# Patient Record
Sex: Female | Born: 1965
Health system: Southern US, Community
[De-identification: ages and names within clinical notes are randomized; demographics above are authoritative.]

## PROBLEM LIST (undated history)

## (undated) DIAGNOSIS — D126 Benign neoplasm of colon, unspecified: Secondary | ICD-10-CM

## (undated) DIAGNOSIS — K219 Gastro-esophageal reflux disease without esophagitis: Secondary | ICD-10-CM

## (undated) DIAGNOSIS — J189 Pneumonia, unspecified organism: Secondary | ICD-10-CM

## (undated) DIAGNOSIS — D649 Anemia, unspecified: Secondary | ICD-10-CM

## (undated) DIAGNOSIS — R001 Bradycardia, unspecified: Secondary | ICD-10-CM

## (undated) DIAGNOSIS — R0789 Other chest pain: Principal | ICD-10-CM

## (undated) HISTORY — PX: OTHER SURGICAL HISTORY: SHX169

## (undated) HISTORY — DX: Bradycardia, unspecified: R00.1

## (undated) HISTORY — PX: BREAST SURGERY: SHX581

## (undated) HISTORY — PX: TUBAL LIGATION: SHX77

## (undated) HISTORY — PX: UPPER GI ENDOSCOPY: SHX6162

## (undated) HISTORY — PX: HAND SURGERY: SHX662

## (undated) HISTORY — PX: ORIF ACETABULAR FRACTURE: SHX5029

## (undated) HISTORY — DX: Benign neoplasm of colon, unspecified: D12.6

## (undated) HISTORY — DX: Other chest pain: R07.89

## (undated) HISTORY — PX: EYE SURGERY: SHX253

---

## 1991-05-01 HISTORY — PX: WISDOM TOOTH EXTRACTION: SHX21

## 1999-04-03 ENCOUNTER — Observation Stay (HOSPITAL_COMMUNITY): Admission: AD | Admit: 1999-04-03 | Discharge: 1999-04-04 | Payer: Self-pay | Admitting: Obstetrics and Gynecology

## 1999-06-06 ENCOUNTER — Inpatient Hospital Stay (HOSPITAL_COMMUNITY): Admission: AD | Admit: 1999-06-06 | Discharge: 1999-06-19 | Payer: Self-pay | Admitting: Obstetrics & Gynecology

## 1999-06-06 ENCOUNTER — Encounter: Payer: Self-pay | Admitting: Obstetrics and Gynecology

## 1999-06-20 ENCOUNTER — Encounter: Admission: RE | Admit: 1999-06-20 | Discharge: 1999-08-09 | Payer: Self-pay | Admitting: Obstetrics and Gynecology

## 1999-07-28 ENCOUNTER — Other Ambulatory Visit: Admission: RE | Admit: 1999-07-28 | Discharge: 1999-07-28 | Payer: Self-pay | Admitting: Obstetrics and Gynecology

## 1999-11-08 ENCOUNTER — Other Ambulatory Visit: Admission: RE | Admit: 1999-11-08 | Discharge: 1999-11-08 | Payer: Self-pay | Admitting: *Deleted

## 2000-02-16 ENCOUNTER — Other Ambulatory Visit: Admission: RE | Admit: 2000-02-16 | Discharge: 2000-02-16 | Payer: Self-pay | Admitting: *Deleted

## 2000-05-29 ENCOUNTER — Other Ambulatory Visit: Admission: RE | Admit: 2000-05-29 | Discharge: 2000-05-29 | Payer: Self-pay | Admitting: *Deleted

## 2000-10-23 ENCOUNTER — Other Ambulatory Visit: Admission: RE | Admit: 2000-10-23 | Discharge: 2000-10-23 | Payer: Self-pay | Admitting: *Deleted

## 2001-11-26 ENCOUNTER — Other Ambulatory Visit: Admission: RE | Admit: 2001-11-26 | Discharge: 2001-11-26 | Payer: Self-pay | Admitting: Obstetrics & Gynecology

## 2002-11-30 ENCOUNTER — Other Ambulatory Visit: Admission: RE | Admit: 2002-11-30 | Discharge: 2002-11-30 | Payer: Self-pay | Admitting: Obstetrics & Gynecology

## 2005-11-28 ENCOUNTER — Encounter: Admission: RE | Admit: 2005-11-28 | Discharge: 2005-11-28 | Payer: Self-pay | Admitting: Obstetrics and Gynecology

## 2005-12-13 ENCOUNTER — Encounter: Admission: RE | Admit: 2005-12-13 | Discharge: 2005-12-13 | Payer: Self-pay | Admitting: Obstetrics and Gynecology

## 2006-05-17 ENCOUNTER — Ambulatory Visit: Payer: Self-pay | Admitting: Internal Medicine

## 2006-05-30 ENCOUNTER — Ambulatory Visit: Payer: Self-pay | Admitting: Internal Medicine

## 2006-05-30 ENCOUNTER — Encounter (INDEPENDENT_AMBULATORY_CARE_PROVIDER_SITE_OTHER): Payer: Self-pay | Admitting: *Deleted

## 2007-01-02 ENCOUNTER — Encounter: Admission: RE | Admit: 2007-01-02 | Discharge: 2007-01-02 | Payer: Self-pay | Admitting: Obstetrics and Gynecology

## 2008-01-06 ENCOUNTER — Encounter: Admission: RE | Admit: 2008-01-06 | Discharge: 2008-01-06 | Payer: Self-pay | Admitting: Obstetrics and Gynecology

## 2008-06-14 ENCOUNTER — Emergency Department (HOSPITAL_COMMUNITY): Admission: EM | Admit: 2008-06-14 | Discharge: 2008-06-14 | Payer: Self-pay | Admitting: Emergency Medicine

## 2008-07-26 ENCOUNTER — Encounter: Admission: RE | Admit: 2008-07-26 | Discharge: 2008-07-26 | Payer: Self-pay | Admitting: Obstetrics and Gynecology

## 2009-02-11 ENCOUNTER — Encounter: Admission: RE | Admit: 2009-02-11 | Discharge: 2009-02-11 | Payer: Self-pay | Admitting: Obstetrics and Gynecology

## 2010-02-14 ENCOUNTER — Encounter: Admission: RE | Admit: 2010-02-14 | Discharge: 2010-02-14 | Payer: Self-pay | Admitting: Obstetrics and Gynecology

## 2010-02-24 ENCOUNTER — Encounter: Admission: RE | Admit: 2010-02-24 | Discharge: 2010-02-24 | Payer: Self-pay | Admitting: Obstetrics and Gynecology

## 2010-05-21 ENCOUNTER — Encounter: Payer: Self-pay | Admitting: Obstetrics and Gynecology

## 2010-09-15 NOTE — Op Note (Signed)
[facility]  Patient:    Michelle Mcneil                       MRN: [id number] Proc. Date: 04/03/99 Adm. Date:  [id number] Attending:  Osborn Coho                           Operative Report  PREOPERATIVE DIAGNOSIS:       Intrauterine pregnancy at 20 weeks estimated gestational age.  History of incompetent cervix.  POSTOPERATIVE DIAGNOSIS:      Intrauterine pregnancy at 20 weeks estimated gestational age.  History of incompetent cervix.  OPERATION:                    Placement of McDonald cerclage.  SURGEON:                      Mark E. Dareen Piano, M.D.  ASSISTANT:  ANESTHESIA:                   Spinal.  ESTIMATED BLOOD LOSS:         Minimal.  COMPLICATIONS:                None.  DESCRIPTION OF PROCEDURE:     The patient was taken to the operating room where a spinal anesthetic was administered without complications.  She was then placed n the dorsal lithotomy position and prepped with Hibiclens.  Her bladder was drained with a red rubber catheter.  A sterile speculum was placed in the vagina.  1 Prolene was used for McDonald cerclage.  The cerclage was placed in a circumferential counter clockwise fashion.  One suture was placed.  The patient  tolerated the procedure well.  She was taken to the recovery room in stable condition.  The patient will be monitored for contractions.  If no contractions are noted six to eight hours from now, she will be discharged to home.  The patient had Ancef 1 gram used for prophylaxis. DD:  04/03/99 TD:  04/03/99 Job: [id number]

## 2010-09-15 NOTE — Discharge Summary (Signed)
[facility]  Patient:    Michelle Mcneil, Michelle Mcneil                      MRN: [id number] Adm. Date:  [id number] Disc. Date: [id number] Attending:  Conley Simmonds A Dictator:   Leilani Able, P.A.                           Discharge Summary  FINAL DIAGNOSES:              1. Intrauterine pregnancy at 38-4/[redacted] weeks gestation.                               2. Preterm premature rupture of membranes.                               3. Status post McDonald cerclage at 20 weeks.                               4. Spontaneous vaginal delivery of a female infant ith                                  Apgars of 5 and 7.  HISTORY OF PRESENT ILLNESS:   This 45 year old, G3, P0-1-1-1, presents at 28+ weeks gestation with preterm premature rupture of membranes.  The patient did not have any contractions and was not having any fevers at this time.  She had had a McDonald cerclage placed around 20 weeks.  HOSPITAL COURSE:              She was admitted at this time for cultures, started on ampicillin 2 g IV every six hours, and started on betamethasone protocol. Labs were checked and an ultrasound was obtained.  A neonatology consult was also given to the patient for the potential of a preterm delivery.  The cerclage was to remain in place unless infection developed or tocolysis failed.  At the beginning of the patients hospital course, she was not having any contractions or cramping and no leaking of fluid.  The fetal heart rate was reactive.  The patient was taken off IV ampicillin and started on amoxicillin 500 mg t.i.d. during her hospital course.  She continued to have no signs of chorioamnionitis.  She was also not having any contractions and tocolysis was not needed to be used.  On hospital day #15, which was June 17, 1999, the patient started developing contractions.  Her sedimentation rate was 50.  Labor was thought to be secondary to  subclinical chorioamnionitis.  At this point, cerclage was to be removed and the patient would be allowed to deliver.  The cerclage was removed.  An epidural was placed.  The  patient was started on Ancef 1 g IV every eight hours.  She progressed rapidly o labor.  The fetal heart rate was reactive with some variable deceleration.  She  dilated to complete.  She had spontaneous vaginal delivery of a 3 pound 8 ounce  female infant with Apgars of 5 and 7.  The pediatrics team was present.  There were no vaginal lacerations.  The procedure went without complications.  The baby was taken to the NICU at this  point.  The patients postpartum course was benign without significant fever.  She was felt ready for discharge on postpartum day 2. Upon her discharge, the baby was stable in the NICU and she was pumping for breast milk.  DIET:                         She was sent home on a regular diet.  ACTIVITIES:                   Told to decrease activities.  DISCHARGE MEDICATIONS:        Told to continue prenatal vitamins.  She was going to use over-the-counter pain medicines as needed.  Was given FESO4 325 mg one b.i.d.  FOLLOW-UP:                    Told to follow up in the office in four to six weeks.  LABORATORY DATA:              On discharge, the patient had a hemoglobin of 10.7 and white blood cell count of 19.9. DD:  07/07/99 TD:  07/08/99 Job: [id number]

## 2011-01-02 ENCOUNTER — Other Ambulatory Visit: Payer: Self-pay | Admitting: Obstetrics and Gynecology

## 2011-01-02 DIAGNOSIS — Z1231 Encounter for screening mammogram for malignant neoplasm of breast: Secondary | ICD-10-CM

## 2011-02-23 ENCOUNTER — Ambulatory Visit
Admission: RE | Admit: 2011-02-23 | Discharge: 2011-02-23 | Disposition: A | Discharge status: Home / Self Care | Payer: 59 | Source: Ambulatory Visit | Attending: Obstetrics and Gynecology | Admitting: Obstetrics and Gynecology

## 2011-02-23 ENCOUNTER — Ambulatory Visit: Payer: Self-pay

## 2011-02-23 DIAGNOSIS — Z1231 Encounter for screening mammogram for malignant neoplasm of breast: Secondary | ICD-10-CM

## 2011-06-22 ENCOUNTER — Encounter: Payer: Self-pay | Admitting: Internal Medicine

## 2011-06-28 ENCOUNTER — Other Ambulatory Visit (HOSPITAL_COMMUNITY): Payer: Self-pay | Admitting: Otolaryngology

## 2011-07-04 ENCOUNTER — Other Ambulatory Visit (HOSPITAL_COMMUNITY): Payer: Self-pay

## 2011-07-04 ENCOUNTER — Inpatient Hospital Stay (HOSPITAL_COMMUNITY): Admission: RE | Admit: 2011-07-04 | Payer: Self-pay | Source: Ambulatory Visit

## 2011-07-04 ENCOUNTER — Ambulatory Visit (HOSPITAL_COMMUNITY): Payer: 59 | Attending: Otolaryngology

## 2011-08-28 ENCOUNTER — Encounter: Payer: Self-pay | Admitting: Internal Medicine

## 2011-10-19 ENCOUNTER — Encounter: Payer: Self-pay | Admitting: Internal Medicine

## 2011-12-17 ENCOUNTER — Ambulatory Visit (AMBULATORY_SURGERY_CENTER): Payer: 59 | Admitting: *Deleted

## 2011-12-17 VITALS — Ht 62.0 in | Wt 155.0 lb

## 2011-12-17 DIAGNOSIS — Z1211 Encounter for screening for malignant neoplasm of colon: Secondary | ICD-10-CM

## 2011-12-17 MED ORDER — MOVIPREP 100 G PO SOLR: ORAL | Status: DC

## 2011-12-18 ENCOUNTER — Encounter: Payer: Self-pay | Admitting: Internal Medicine

## 2011-12-21 ENCOUNTER — Telehealth: Payer: Self-pay | Admitting: *Deleted

## 2011-12-21 ENCOUNTER — Telehealth: Payer: Self-pay | Admitting: Internal Medicine

## 2011-12-21 NOTE — Telephone Encounter (Signed)
Spoke with Vinay at [facility]. Pt has 50.00 copay with moviprep and was inquiring if they could use generic. Informed that we do not use generic and that patient could use suprep but would have to come back in for instructions per Dottie. Inquired if he was aware if the patient had been given a rebate which he did not know. He was going to call the patient to tell her there was no generic available and inquire about the rebate and inform her to ask for it when she comes in for her procedure if she had not received one. I also told him that I would call the patient and let her know the same.

## 2011-12-21 NOTE — Telephone Encounter (Signed)
Called and spoke with patient regarding phone call from Vinay at [facility] that prep was too expensive. She was only inquiring if there may be a generic. Informed her that we do not use the generic form and she was accepting of that. Inquired if she had been given a rebate for the Movi-prep and she states that she was. She is agreeable to using moviprep and will pick it up from her pharmacy.

## 2011-12-28 ENCOUNTER — Telehealth: Payer: Self-pay | Admitting: Internal Medicine

## 2011-12-28 DIAGNOSIS — Z86010 Personal history of colonic polyps: Secondary | ICD-10-CM

## 2012-01-01 ENCOUNTER — Other Ambulatory Visit: Payer: Self-pay | Admitting: *Deleted

## 2012-01-01 DIAGNOSIS — Z86010 Personal history of colonic polyps: Secondary | ICD-10-CM

## 2012-01-01 NOTE — Telephone Encounter (Signed)
Scheduled patient at [facility] endo on 01/07/12 at 11:15 AM Lanora Manis) Case number [id number]. Left a message for patient to call me.

## 2012-01-01 NOTE — Telephone Encounter (Signed)
Please add her for colon this coming Monday 01/08/2012 after Mr Mordecai Maes at [facility]

## 2012-01-01 NOTE — Telephone Encounter (Signed)
Monday is the only day patient can have her recall colonoscopy due to scheduling issues. She is interested in getting it done as soon as possible because she is already late in getting it scheduled. She understands that Dr. Juanda Chance has limited appointments on Monday and is willing to switch MD's if needed. Please, advise

## 2012-01-01 NOTE — Telephone Encounter (Signed)
Left a message for patient to call me. 

## 2012-01-02 ENCOUNTER — Encounter: Payer: 59 | Admitting: Internal Medicine

## 2012-01-02 NOTE — Telephone Encounter (Signed)
I will not be available on Sept 16. She is too picky for somebody who needs screening colonoscopy. Just schedule her for the , Monday ,sept 23.

## 2012-01-02 NOTE — Telephone Encounter (Signed)
Spoke with patient and she cannot do the colonoscopy on 01/07/12 but is willing to do it on the next Monday 01/14/12. Please, advise.

## 2012-01-03 NOTE — Telephone Encounter (Signed)
Spoke with patient and offered Sept 29th for patient to have her procedure. She states she does not understand why she cannot switch doctors and have the procedure on Sept. 16th at 4:00 PM like the scheduler told her since it was the next available. Explained to patient the procedure to switch physicians and she continues to state I don't understand why I can't just switch MD to the one that has the next available procedure time that I want. Patient states she will take the Sept 29th date and cancel if she cannot do it. Left a message with [facility] endo to call me to change appointment.

## 2012-01-03 NOTE — Telephone Encounter (Signed)
Spoke with [facility] endo and moved patient to 12/21/11 9:15 AM arrival for 10:15 AM procedure. Pattricia Boss will call patient and offer this time.

## 2012-01-04 NOTE — Telephone Encounter (Signed)
Michelle Mcneil spoke with patient and patient agreed to procedure date/time. Reviewed instructions for colonoscopy.  She already did a pre visit and has her Moviprep.

## 2012-01-21 ENCOUNTER — Encounter (HOSPITAL_COMMUNITY): Payer: Self-pay | Admitting: *Deleted

## 2012-01-21 ENCOUNTER — Ambulatory Visit (HOSPITAL_COMMUNITY)
Admission: RE | Admit: 2012-01-21 | Discharge: 2012-01-21 | Disposition: A | Discharge status: Home / Self Care | Payer: 59 | Source: Ambulatory Visit | Attending: Internal Medicine | Admitting: Internal Medicine

## 2012-01-21 ENCOUNTER — Encounter (HOSPITAL_COMMUNITY)
Admission: RE | Disposition: A | Discharge status: Home / Self Care | Payer: Self-pay | Source: Ambulatory Visit | Attending: Internal Medicine

## 2012-01-21 DIAGNOSIS — Z860100 Personal history of colon polyps, unspecified: Secondary | ICD-10-CM

## 2012-01-21 DIAGNOSIS — Z1211 Encounter for screening for malignant neoplasm of colon: Secondary | ICD-10-CM | POA: Insufficient documentation

## 2012-01-21 HISTORY — PX: COLONOSCOPY: SHX5424

## 2012-01-21 SURGERY — COLONOSCOPY
Anesthesia: Moderate Sedation

## 2012-01-21 MED ORDER — FENTANYL CITRATE 0.05 MG/ML IJ SOLN
INTRAMUSCULAR | Status: DC | PRN
  Administered 2012-01-21 (×3): 25 ug via INTRAVENOUS

## 2012-01-21 MED ORDER — DIPHENHYDRAMINE HCL 50 MG/ML IJ SOLN
INTRAMUSCULAR | Status: AC
  Filled 2012-01-21: qty 1

## 2012-01-21 MED ORDER — FENTANYL CITRATE 0.05 MG/ML IJ SOLN
INTRAMUSCULAR | Status: AC
  Filled 2012-01-21: qty 4

## 2012-01-21 MED ORDER — MIDAZOLAM HCL 5 MG/5ML IJ SOLN
INTRAMUSCULAR | Status: DC | PRN
  Administered 2012-01-21 (×3): 2 mg via INTRAVENOUS

## 2012-01-21 MED ORDER — SODIUM CHLORIDE 0.9 % IV SOLN: INTRAVENOUS | Status: DC

## 2012-01-21 MED ORDER — MIDAZOLAM HCL 10 MG/2ML IJ SOLN
INTRAMUSCULAR | Status: AC
  Filled 2012-01-21: qty 4

## 2012-01-21 NOTE — Op Note (Signed)
[facility] [address] [place], [address]   COLONOSCOPY PROCEDURE REPORT  PATIENT: Michelle, Mcneil  MR#: [id number] BIRTHDATE: Jul 30, 1965 , 46  yrs. old GENDER: Female ENDOSCOPIST: Hart Carwin, MD REFERRED BY:  Maxie Better, M.D. PROCEDURE DATE:  01/21/2012 PROCEDURE:   Colonoscopy, surveillance ASA CLASS:   Class I INDICATIONS:patient's personal history of adenomatous colon polyps and tubular adenoma 2008. MEDICATIONS: These medications were titrated to patient response per physician's verbal order, Versed-Detailed 6 mg IV, and Fentanyl-Detailed 75 mcg IV  DESCRIPTION OF PROCEDURE:   After the risks and benefits and of the procedure were explained, informed consent was obtained.  A digital rectal exam revealed no abnormalities of the rectum.    The Pentax Ped Colon [id number]  endoscope was introduced through the anus and advanced to the cecum, which was identified by both the appendix and ileocecal valve .  The quality of the prep was good, using MoviPrep .  The instrument was then slowly withdrawn as the colon was fully examined.     COLON FINDINGS: A normal appearing cecum, ileocecal valve, and appendiceal orifice were identified.  The ascending, hepatic flexure, transverse, splenic flexure, descending, sigmoid colon and rectum appeared unremarkable.  No polyps or cancers were seen. Retroflexed views revealed no abnormalities.     The scope was then withdrawn from the patient and the procedure completed.  COMPLICATIONS: There were no complications. ENDOSCOPIC IMPRESSION: Normal colon  RECOMMENDATIONS: High fiber diet   REPEAT EXAM: In 10 year(s)  for Colonoscopy.  cc:  _______________________________ eSignedHart Carwin, MD 01/21/2012 11:34 AM

## 2012-01-21 NOTE — H&P (Signed)
  Michelle Mcneil July 10, 1965 MRN [id number]        History of Present Illness:  This is a 46 yo  Female  whi is due for recall colonoscopy. She denies any symptoms.. She has a hx of tubular adenoma in Jan 2008.    Past Medical History  Diagnosis Date  . Adenomatous colon polyp    Past Surgical History  Procedure Date  . Wisdom tooth extraction 1993    reports that she has never smoked. She has never used smokeless tobacco. She reports that she does not drink alcohol or use illicit drugs. family history includes Colon cancer in her maternal aunt and Colon polyps in her mother. No Known Allergies      Review of Systems: no compleints  The remainder of the 10 point ROS is negative except as outlined in H&P   Physical Exam: General appearance  Well developed, in no distress. Eyes- non icteric. HEENT nontraumatic, normocephalic. Mouth no lesions, tongue papillated, no cheilosis. Neck supple without adenopathy, thyroid not enlarged, no carotid bruits, no JVD. Lungs Clear to auscultation bilaterally. Cor normal S1, normal S2, regular rhythm, no murmur,  quiet precordium. Abdomen: soft non tender abdomen Rectal:deferred Extremities no pedal edema. Skin no lesions. Neurological alert and oriented x 3. Psychological normal mood and affect.  Assessment and Plan:  46 yo female prepped for recall colonoscopy , hx of tubular adenoma 2008   01/21/2012 Lina Sar

## 2012-01-21 NOTE — Interval H&P Note (Signed)
History and Physical Interval Note:  01/21/2012 10:58 AM  Michelle Mcneil  has presented today for surgery, with the diagnosis of recall colonoscopy  The various methods of treatment have been discussed with the patient and family. After consideration of risks, benefits and other options for treatment, the patient has consented to  Procedure(s) (LRB) with comments: COLONOSCOPY (N/A) as a surgical intervention .  The patient's history has been reviewed, patient examined, no change in status, stable for surgery.  I have reviewed the patient's chart and labs.  Questions were answered to the patient's satisfaction.     Lina Sar

## 2012-01-22 ENCOUNTER — Encounter (HOSPITAL_COMMUNITY): Payer: Self-pay | Admitting: Internal Medicine

## 2012-03-07 ENCOUNTER — Other Ambulatory Visit: Payer: Self-pay | Admitting: Obstetrics and Gynecology

## 2012-03-07 DIAGNOSIS — Z1231 Encounter for screening mammogram for malignant neoplasm of breast: Secondary | ICD-10-CM

## 2012-03-18 ENCOUNTER — Ambulatory Visit
Admission: RE | Admit: 2012-03-18 | Discharge: 2012-03-18 | Disposition: A | Discharge status: Home / Self Care | Payer: 59 | Source: Ambulatory Visit | Attending: Obstetrics and Gynecology | Admitting: Obstetrics and Gynecology

## 2012-03-18 DIAGNOSIS — Z1231 Encounter for screening mammogram for malignant neoplasm of breast: Secondary | ICD-10-CM

## 2012-10-08 ENCOUNTER — Telehealth: Payer: Self-pay | Admitting: Internal Medicine

## 2012-10-08 NOTE — Telephone Encounter (Signed)
I don't want to be mean but is likely that she will  put high demands on our staff 's time in the future. So, ought to look into finding another practice. I will not be able to satisfy her.

## 2012-10-08 NOTE — Telephone Encounter (Signed)
OK to switch but before Dr Marina Goodell accepts her, let him read the tel note from 12/21/2011 to see how frustrating it was to schedule her screening colonoscpy. I came on my day off to do that.

## 2012-10-08 NOTE — Telephone Encounter (Signed)
No thank you. I don't believe that I am any more available than Dr Juanda Chance

## 2012-10-08 NOTE — Telephone Encounter (Signed)
Pt wants to switch to a doctor that is available more. Requesting to switch from Dr. Juanda Chance to Dr. Marina Goodell. Dr. Juanda Chance will you approve the switch? Please advise.

## 2012-10-08 NOTE — Telephone Encounter (Signed)
Dr. Juanda Chance will you keep this pt?

## 2012-10-09 NOTE — Telephone Encounter (Signed)
Spoke with patient and told her she will need to seek another practice.

## 2013-03-04 ENCOUNTER — Other Ambulatory Visit: Payer: Self-pay

## 2013-03-04 DIAGNOSIS — Z1231 Encounter for screening mammogram for malignant neoplasm of breast: Secondary | ICD-10-CM

## 2013-04-07 ENCOUNTER — Ambulatory Visit
Admission: RE | Admit: 2013-04-07 | Discharge: 2013-04-07 | Disposition: A | Discharge status: Home / Self Care | Payer: 59 | Source: Ambulatory Visit

## 2013-04-07 DIAGNOSIS — Z1231 Encounter for screening mammogram for malignant neoplasm of breast: Secondary | ICD-10-CM

## 2013-04-08 ENCOUNTER — Ambulatory Visit: Payer: 59

## 2013-04-13 ENCOUNTER — Other Ambulatory Visit: Payer: Self-pay | Admitting: Obstetrics and Gynecology

## 2013-04-13 DIAGNOSIS — R928 Other abnormal and inconclusive findings on diagnostic imaging of breast: Secondary | ICD-10-CM

## 2013-04-20 ENCOUNTER — Other Ambulatory Visit: Payer: Self-pay | Admitting: Obstetrics and Gynecology

## 2013-04-20 ENCOUNTER — Ambulatory Visit
Admission: RE | Admit: 2013-04-20 | Discharge: 2013-04-20 | Disposition: A | Discharge status: Home / Self Care | Payer: 59 | Source: Ambulatory Visit | Attending: Obstetrics and Gynecology | Admitting: Obstetrics and Gynecology

## 2013-04-20 DIAGNOSIS — R928 Other abnormal and inconclusive findings on diagnostic imaging of breast: Secondary | ICD-10-CM

## 2013-05-18 ENCOUNTER — Encounter (INDEPENDENT_AMBULATORY_CARE_PROVIDER_SITE_OTHER): Payer: Self-pay | Admitting: Surgery

## 2013-05-18 ENCOUNTER — Encounter (INDEPENDENT_AMBULATORY_CARE_PROVIDER_SITE_OTHER): Payer: Self-pay

## 2013-05-18 ENCOUNTER — Ambulatory Visit (INDEPENDENT_AMBULATORY_CARE_PROVIDER_SITE_OTHER): Payer: 59 | Admitting: Surgery

## 2013-05-18 VITALS — BP 116/80 | HR 60 | Temp 98.4°F | Resp 14 | Ht 61.0 in | Wt 174.0 lb

## 2013-05-18 DIAGNOSIS — N6310 Unspecified lump in the right breast, unspecified quadrant: Secondary | ICD-10-CM

## 2013-05-18 DIAGNOSIS — N63 Unspecified lump in unspecified breast: Secondary | ICD-10-CM

## 2013-05-18 NOTE — Progress Notes (Signed)
Patient ID: Michelle Mcneil, female   DOB: 05/14/1965, 48 y.o.   MRN: [id number]  Chief Complaint  Patient presents with  . New Evaluation    eval RT br fibroadneoma    HPI Michelle Mcneil is a 48 y.o. female.  Patient sent a request of Dr. Merrilyn Puma of the [place] for right breast mass. Patient underwent mammography and subsequent ultrasound due to mammographic abnormalities in both breasts. Patient found to have simple left breast cyst that was aspirated resolved. Patient has complex benign-appearing right breast mass. She was offered observation versus biopsy and wished to have it excised. She is here today to discuss that. Denies any symptoms of either breast. Denies any breast mass. HPI  Past Medical History  Diagnosis Date  . Adenomatous colon polyp     Past Surgical History  Procedure Laterality Date  . Wisdom tooth extraction  1993  . Colonoscopy  01/21/2012    Procedure: COLONOSCOPY;  Surgeon: Lafayette Dragon, MD;  Location: [facility] ENDOSCOPY;  Service: Endoscopy;  Laterality: N/A;    Family History  Problem Relation Age of Onset  . Colon polyps Mother   . Colon cancer Maternal Aunt   . Cancer Maternal Aunt     colon  . Cancer Sister     breast  . Cancer Son     osteosarcoma    Social History History  Substance Use Topics  . Smoking status: Never Smoker   . Smokeless tobacco: Never Used  . Alcohol Use: No    No Known Allergies  Current Outpatient Prescriptions  Medication Sig Dispense Refill  . Multiple Vitamins-Minerals (MULTIVITAMIN WITH MINERALS) tablet Take 1 tablet by mouth daily.       No current facility-administered medications for this visit.    Review of Systems Review of Systems  Constitutional: Negative for fever, chills and unexpected weight change.  HENT: Negative for congestion, hearing loss, sore throat, trouble swallowing and voice change.   Eyes: Negative for visual disturbance.  Respiratory: Negative for cough and wheezing.    Cardiovascular: Negative for chest pain, palpitations and leg swelling.  Gastrointestinal: Negative for nausea, vomiting, abdominal pain, diarrhea, constipation, blood in stool, abdominal distention and anal bleeding.  Genitourinary: Negative for hematuria, vaginal bleeding and difficulty urinating.  Musculoskeletal: Negative for arthralgias.  Skin: Negative for rash and wound.  Neurological: Negative for seizures, syncope and headaches.  Hematological: Negative for adenopathy. Does not bruise/bleed easily.  Psychiatric/Behavioral: Negative for confusion.    Blood pressure 116/80, pulse 60, temperature 98.4 F (36.9 C), temperature source Temporal, resp. rate 14, height 5\' 1"  (1.549 m), weight 174 lb (78.926 kg).  Physical Exam Physical Exam  Constitutional: She is oriented to person, place, and time. She appears well-developed and well-nourished.  HENT:  Head: Normocephalic and atraumatic.  Eyes: EOM are normal. Pupils are equal, round, and reactive to light.  Neck: Normal range of motion. Neck supple.  Cardiovascular: Normal rate and regular rhythm.   Pulmonary/Chest: Right breast exhibits no inverted nipple, no mass, no nipple discharge, no skin change and no tenderness. Left breast exhibits no inverted nipple, no mass, no nipple discharge, no skin change and no tenderness. Breasts are symmetrical.  Abdominal: Soft.  Musculoskeletal: Normal range of motion.  Neurological: She is alert and oriented to person, place, and time.  Skin: Skin is warm and dry.  Psychiatric: She has a normal mood and affect. Her behavior is normal. Judgment and thought content normal.    Data Reviewed  CLINICAL DATA: Suspected complex cysts versus probably benign solid  masses bilaterally on diagnostic evaluation for bilateral masses  seen mammographically performed earlier in the day  EXAM:  ULTRASOUND GUIDED left BREAST CYST ASPIRATION and attempted right  breast cyst aspiration  COMPARISON:  Bilateral breast ultrasound performed earlier in the  day  PROCEDURE:  Using sterile technique, 2% lidocaine, ultrasound guidance, and an  18 gauge needle, aspiration was performed of a complex cyst in the  left breast at 3 o'clock with complete resolution. 0.3 cc of  yellowish cyst fluid was obtained. Given its benign appearance, this  was not sent for cytologic evaluation.  Attempted aspiration of a suspected complex right breast cyst vs.  probably benign solid mass was undertaken using sterile technique,  2% lidocaine ultrasound guidance and an 18 gauge needle. The needle  was advanced with ultrasound guidance into the hypoechoic nodule  positioned at 10 o'clock on the right. No fluid could be aspirated  confirming that this represents a solid mass.  IMPRESSION:  Ultrasound-guided aspiration of left breast cyst with resulting  benign appearing cyst fluid. No apparent complications.  Attempted ultrasound-guided aspiration of a right breast hypoechoic  nodule with no fluid obtained. This confirms that this is a solid  lesion, most likely representing a fibroadenoma. This has  sonographic features compatible with a probably benign process.  RECOMMENDATION:  Given the solid nature of the probably benign mass on the right, we  discussed the possibility of ultrasound-guided core biopsy, surgical  excision for short-term sonographic followup in 6, 12 and 24 months.  The patient agreed with short-term sonographic evaluation with the  understanding that ultrasound-guided core biopsy can be pursued  should she desire prior to initial six-month evaluation.  BI-RADS: 3: Probably benign finding(s) - short interval follow-up  suggested.  Electronically Signed  By: Becky Kennedy M.D.  On: 04/20/2013 12:02  Assessment    Right breast mass complex sclerosing  Simple left breast cyst resolved    Plan    Discussed options of observation, core biopsy versus excision of right breast mass. It  does have benign characteristics. The patient wishes excision of this. Discussed the pros and cons of this approach. Discussed lumpectomy and potential complications of this. She wishes to proceed with excision of right breast mass.The procedure has been discussed with the patient. Alternatives to surgery have been discussed with the patient.  Risks of surgery include bleeding,  Infection,  Seroma formation, death,  and the need for further surgery.   The patient understands and wishes to proceed.       Zebulin Siegel A. 05/18/2013, 12:57 PM    

## 2013-05-18 NOTE — Patient Instructions (Signed)
Lumpectomy A lumpectomy is a form of "breast conserving" or "breast preservation" surgery. It may also be referred to as a partial mastectomy. During a lumpectomy, the portion of the breast that contains the cancerous tumor or breast mass (the lump) is removed. Some normal tissue around the lump may also be removed to make sure all the tumor has been removed. This surgery should take 40 minutes or less. LET YOUR HEALTH CARE PROVIDER KNOW ABOUT:  Any allergies you have.  All medicines you are taking, including vitamins, herbs, eye drops, creams, and over-the-counter medicines.  Previous problems you or members of your family have had with the use of anesthetics.  Any blood disorders you have.  Previous surgeries you have had.  Medical conditions you have. RISKS AND COMPLICATIONS Generally, this is a safe procedure. However, as with any procedure, complications can occur. Possible complications include:  Bleeding.  Infection.  Pain.  Temporary swelling.  Change in the shape of the breast, particularly if a large portion is removed. BEFORE THE PROCEDURE  Ask your health care provider about changing or stopping your regular medicines.  Do not eat or drink anything for 7 8 hours before the surgery or as directed by your health care provider. Ask your health care provider if you can take a sip of water with any approved medicines.  On the day of surgery, your healthcare provider will use a mammogram or ultrasound to locate and mark the tumor in your breast. These markings on your breast will show where the cut (incision) will be made. PROCEDURE   An IV tube will be put into one of your veins.  You may be given medicine to help you relax before the surgery (sedative). You will be given one of the following:  A medicine that numbs the area (local anesthesia).  A medicine that makes you go to sleep (general anesthesia).  Your health care provider will use a kind of electric scalpel  that uses heat to minimize bleeding (electrocautery knife).  A curved incision (like a smile or frown) that follows the natural curve of your breast is made, to allow for minimal scarring and better healing.  The tumor will be removed with some of the surrounding tissue. This will be sent to the lab for analysis. Your health care provider may also remove your lymph nodes at this time if needed.  Sometimes, but not always, a rubber tube called a drain will be surgically inserted into your breast area or armpit to collect excess fluid that may accumulate in the space where the tumor was. This drain is connected to a plastic bulb on the outside of your body. This drain creates suction to help remove the fluid.  The incisions will be closed with stitches (sutures).  A bandage may be placed over the incisions. AFTER THE PROCEDURE  You will be taken to the recovery area.  You will be given medicine for pain.  A small rubber drain may be placed in the breast for 2 3 days to prevent a collection of blood (hematoma) from developing in the breast. You will be given instructions on caring for the drain before you go home.  A pressure bandage (dressing) will be applied for 1 2 days to prevent bleeding. Ask your health care provider how to care for your bandage at home. Document Released: 05/28/2006 Document Revised: 12/17/2012 Document Reviewed: 09/19/2012 [employer] Patient Information 2014 [employer].  

## 2013-05-20 ENCOUNTER — Encounter (HOSPITAL_COMMUNITY): Payer: Self-pay | Admitting: Pharmacy Technician

## 2013-05-27 ENCOUNTER — Encounter (HOSPITAL_COMMUNITY)
Admission: RE | Admit: 2013-05-27 | Discharge: 2013-05-27 | Disposition: A | Discharge status: Home / Self Care | Payer: 59 | Source: Ambulatory Visit | Attending: Surgery | Admitting: Surgery

## 2013-05-27 ENCOUNTER — Encounter (HOSPITAL_COMMUNITY): Payer: Self-pay

## 2013-05-27 VITALS — BP 109/72 | HR 79 | Temp 98.1°F | Resp 18 | Ht 61.0 in | Wt 175.3 lb

## 2013-05-27 DIAGNOSIS — N6310 Unspecified lump in the right breast, unspecified quadrant: Secondary | ICD-10-CM

## 2013-05-27 DIAGNOSIS — Z01818 Encounter for other preprocedural examination: Secondary | ICD-10-CM | POA: Insufficient documentation

## 2013-05-27 DIAGNOSIS — Z01810 Encounter for preprocedural cardiovascular examination: Secondary | ICD-10-CM | POA: Insufficient documentation

## 2013-05-27 HISTORY — DX: Pneumonia, unspecified organism: J18.9

## 2013-05-27 LAB — CBC
HEMATOCRIT: 36.6 % (ref 36.0–46.0)
Hemoglobin: 12.6 g/dL (ref 12.0–15.0)
MCH: 33.2 pg (ref 26.0–34.0)
MCHC: 34.4 g/dL (ref 30.0–36.0)
MCV: 96.3 fL (ref 78.0–100.0)
PLATELETS: 213 10*3/uL (ref 150–400)
RBC: 3.8 MIL/uL — AB (ref 3.87–5.11)
RDW: 14.2 % (ref 11.5–15.5)
WBC: 8.1 10*3/uL (ref 4.0–10.5)

## 2013-05-27 LAB — HCG, SERUM, QUALITATIVE: Preg, Serum: NEGATIVE

## 2013-05-27 LAB — BASIC METABOLIC PANEL
BUN: 13 mg/dL (ref 6–23)
CALCIUM: 8.9 mg/dL (ref 8.4–10.5)
CO2: 24 meq/L (ref 19–32)
CREATININE: 0.86 mg/dL (ref 0.50–1.10)
Chloride: 102 mEq/L (ref 96–112)
GFR calc Af Amer: >90 mL/min (ref >90)
GFR calc non Af Amer: 79 mL/min — ABNORMAL LOW (ref >90)
GLUCOSE: 100 mg/dL — AB (ref 70–99)
Potassium: 4.3 mEq/L (ref 3.7–5.3)
Sodium: 139 mEq/L (ref 137–147)

## 2013-05-27 MED ORDER — CHLORHEXIDINE GLUCONATE 4 % EX LIQD: 1.0000 "application " | Freq: Once | CUTANEOUS | Status: DC

## 2013-05-27 NOTE — Progress Notes (Addendum)
Pt doesn't have a cardiologist  Stress test done in 2008-pt went on her own- was told it was costochondritis  Denies ever having an echo or heart cath  No Medical Md sees gyn   cxr in past yr  [facility] [address] a CXR was done in Fall 2014

## 2013-05-27 NOTE — Pre-Procedure Instructions (Signed)
[name]  05/27/2013   Your procedure is scheduled on:  Wed, Feb 4 @ 9:00 AM  Report to [facility]  at 7:00 AM.  Call this number if you have problems the morning of surgery: [phone]   Remember:   Do not eat food or drink liquids after midnight.      Do not wear jewelry, make-up or nail polish.  Do not wear lotions, powders, or perfumes. You may wear deodorant.  Do not shave 48 hours prior to surgery.   Do not bring valuables to the hospital.  [facility] is not responsible                  for any belongings or valuables.               Contacts, dentures or bridgework may not be worn into surgery.  Leave suitcase in the car. After surgery it may be brought to your room.  For patients admitted to the hospital, discharge time is determined by your                treatment team.               Patients discharged the day of surgery will not be allowed to drive  home.    Special Instructions: Shower using CHG 2 nights before surgery and the night before surgery.  If you shower the day of surgery use CHG.  Use special wash - you have one bottle of CHG for all showers.  You should use approximately 1/3 of the bottle for each shower.   Please read over the following fact sheets that you were given: Pain Booklet, Coughing and Deep Breathing and Surgical Site Infection Prevention

## 2013-06-02 MED ORDER — CEFAZOLIN SODIUM-DEXTROSE 2-3 GM-% IV SOLR
2.0000 g | INTRAVENOUS | Status: AC
  Administered 2013-06-03: 2 g via INTRAVENOUS
  Filled 2013-06-02: qty 50

## 2013-06-03 ENCOUNTER — Encounter (HOSPITAL_COMMUNITY): Payer: Self-pay | Admitting: Anesthesiology

## 2013-06-03 ENCOUNTER — Ambulatory Visit
Admission: RE | Admit: 2013-06-03 | Discharge: 2013-06-03 | Disposition: A | Discharge status: Home / Self Care | Payer: 59 | Source: Ambulatory Visit | Attending: Surgery | Admitting: Surgery

## 2013-06-03 ENCOUNTER — Other Ambulatory Visit (INDEPENDENT_AMBULATORY_CARE_PROVIDER_SITE_OTHER): Payer: Self-pay | Admitting: Surgery

## 2013-06-03 ENCOUNTER — Encounter (HOSPITAL_COMMUNITY): Payer: 59 | Admitting: Anesthesiology

## 2013-06-03 ENCOUNTER — Ambulatory Visit (HOSPITAL_COMMUNITY)
Admission: RE | Admit: 2013-06-03 | Discharge: 2013-06-03 | Disposition: A | Discharge status: Home / Self Care | Payer: 59 | Source: Ambulatory Visit | Attending: Surgery | Admitting: Surgery

## 2013-06-03 ENCOUNTER — Encounter (HOSPITAL_COMMUNITY)
Admission: RE | Disposition: A | Discharge status: Home / Self Care | Payer: Self-pay | Source: Ambulatory Visit | Attending: Surgery

## 2013-06-03 ENCOUNTER — Ambulatory Visit (HOSPITAL_COMMUNITY): Payer: 59 | Admitting: Anesthesiology

## 2013-06-03 DIAGNOSIS — N6310 Unspecified lump in the right breast, unspecified quadrant: Secondary | ICD-10-CM

## 2013-06-03 DIAGNOSIS — D249 Benign neoplasm of unspecified breast: Secondary | ICD-10-CM | POA: Insufficient documentation

## 2013-06-03 DIAGNOSIS — Z9889 Other specified postprocedural states: Secondary | ICD-10-CM

## 2013-06-03 HISTORY — PX: BREAST LUMPECTOMY WITH NEEDLE LOCALIZATION: SHX5759

## 2013-06-03 HISTORY — PX: BREAST EXCISIONAL BIOPSY: SUR124

## 2013-06-03 SURGERY — BREAST LUMPECTOMY WITH NEEDLE LOCALIZATION
Anesthesia: General | Site: Breast | Laterality: Right

## 2013-06-03 MED ORDER — ONDANSETRON HCL 4 MG/2ML IJ SOLN
INTRAMUSCULAR | Status: AC
  Filled 2013-06-03: qty 2

## 2013-06-03 MED ORDER — LIDOCAINE HCL (CARDIAC) 10 MG/ML IV SOLN
INTRAVENOUS | Status: DC | PRN
  Administered 2013-06-03: 80 mg via INTRAVENOUS

## 2013-06-03 MED ORDER — MIDAZOLAM HCL 2 MG/2ML IJ SOLN
INTRAMUSCULAR | Status: AC
  Filled 2013-06-03: qty 2

## 2013-06-03 MED ORDER — SUCCINYLCHOLINE CHLORIDE 20 MG/ML IJ SOLN
INTRAMUSCULAR | Status: AC
  Filled 2013-06-03: qty 1

## 2013-06-03 MED ORDER — EPHEDRINE SULFATE 50 MG/ML IJ SOLN
INTRAMUSCULAR | Status: DC | PRN
  Administered 2013-06-03: 10 mg via INTRAVENOUS

## 2013-06-03 MED ORDER — ONDANSETRON HCL 4 MG/2ML IJ SOLN
INTRAMUSCULAR | Status: DC | PRN
  Administered 2013-06-03: 4 mg via INTRAVENOUS

## 2013-06-03 MED ORDER — PHENYLEPHRINE 40 MCG/ML (10ML) SYRINGE FOR IV PUSH (FOR BLOOD PRESSURE SUPPORT)
PREFILLED_SYRINGE | INTRAVENOUS | Status: AC
  Filled 2013-06-03: qty 10

## 2013-06-03 MED ORDER — BUPIVACAINE-EPINEPHRINE (PF) 0.25% -1:200000 IJ SOLN
INTRAMUSCULAR | Status: AC
  Filled 2013-06-03: qty 30

## 2013-06-03 MED ORDER — PROPOFOL 10 MG/ML IV BOLUS
INTRAVENOUS | Status: DC | PRN
  Administered 2013-06-03: 150 mg via INTRAVENOUS

## 2013-06-03 MED ORDER — MIDAZOLAM HCL 5 MG/5ML IJ SOLN
INTRAMUSCULAR | Status: DC | PRN
  Administered 2013-06-03: 2 mg via INTRAVENOUS

## 2013-06-03 MED ORDER — DEXAMETHASONE SODIUM PHOSPHATE 4 MG/ML IJ SOLN
INTRAMUSCULAR | Status: DC | PRN
  Administered 2013-06-03: 8 mg via INTRAVENOUS

## 2013-06-03 MED ORDER — FENTANYL CITRATE 0.05 MG/ML IJ SOLN
INTRAMUSCULAR | Status: DC | PRN
  Administered 2013-06-03 (×3): 50 ug via INTRAVENOUS

## 2013-06-03 MED ORDER — LACTATED RINGERS IV SOLN
INTRAVENOUS | Status: DC
  Administered 2013-06-03 (×2): via INTRAVENOUS

## 2013-06-03 MED ORDER — LIDOCAINE HCL (CARDIAC) 20 MG/ML IV SOLN
INTRAVENOUS | Status: AC
  Filled 2013-06-03: qty 5

## 2013-06-03 MED ORDER — ROCURONIUM BROMIDE 50 MG/5ML IV SOLN
INTRAVENOUS | Status: AC
  Filled 2013-06-03: qty 1

## 2013-06-03 MED ORDER — DEXAMETHASONE SODIUM PHOSPHATE 4 MG/ML IJ SOLN
INTRAMUSCULAR | Status: AC
  Filled 2013-06-03: qty 2

## 2013-06-03 MED ORDER — BUPIVACAINE-EPINEPHRINE 0.25% -1:200000 IJ SOLN
INTRAMUSCULAR | Status: DC | PRN
  Administered 2013-06-03: 20 mL

## 2013-06-03 MED ORDER — EPHEDRINE SULFATE 50 MG/ML IJ SOLN
INTRAMUSCULAR | Status: AC
  Filled 2013-06-03: qty 1

## 2013-06-03 MED ORDER — 0.9 % SODIUM CHLORIDE (POUR BTL) OPTIME
TOPICAL | Status: DC | PRN
  Administered 2013-06-03: 1000 mL

## 2013-06-03 MED ORDER — PROPOFOL 10 MG/ML IV BOLUS
INTRAVENOUS | Status: AC
  Filled 2013-06-03: qty 20

## 2013-06-03 MED ORDER — SODIUM CHLORIDE 0.9 % IJ SOLN
INTRAMUSCULAR | Status: AC
  Filled 2013-06-03: qty 10

## 2013-06-03 MED ORDER — FENTANYL CITRATE 0.05 MG/ML IJ SOLN
INTRAMUSCULAR | Status: AC
  Filled 2013-06-03: qty 5

## 2013-06-03 MED ORDER — HYDROCODONE-ACETAMINOPHEN 5-325 MG PO TABS: 1.0000 | ORAL_TABLET | Freq: Four times a day (QID) | ORAL | Status: DC | PRN

## 2013-06-03 SURGICAL SUPPLY — 50 items
ADH SKN CLS APL DERMABOND .7 (GAUZE/BANDAGES/DRESSINGS) ×1
ADH SKN CLS LQ APL DERMABOND (GAUZE/BANDAGES/DRESSINGS) ×1
APPLIER CLIP 9.375 MED OPEN (MISCELLANEOUS)
APR CLP MED 9.3 20 MLT OPN (MISCELLANEOUS)
BINDER BREAST LRG (GAUZE/BANDAGES/DRESSINGS) IMPLANT
BINDER BREAST XLRG (GAUZE/BANDAGES/DRESSINGS) IMPLANT
BLADE SURG 15 STRL LF DISP TIS (BLADE) ×1 IMPLANT
BLADE SURG 15 STRL SS (BLADE) ×3
BLADE SURG ROTATE 9660 (MISCELLANEOUS) ×2 IMPLANT
CANISTER SUCTION 2500CC (MISCELLANEOUS) ×2 IMPLANT
CHLORAPREP W/TINT 26ML (MISCELLANEOUS) ×3 IMPLANT
CLIP APPLIE 9.375 MED OPEN (MISCELLANEOUS) IMPLANT
COVER SURGICAL LIGHT HANDLE (MISCELLANEOUS) ×3 IMPLANT
DERMABOND ADHESIVE PROPEN (GAUZE/BANDAGES/DRESSINGS) ×2
DERMABOND ADVANCED (GAUZE/BANDAGES/DRESSINGS) ×2
DERMABOND ADVANCED .7 DNX12 (GAUZE/BANDAGES/DRESSINGS) ×1 IMPLANT
DERMABOND ADVANCED .7 DNX6 (GAUZE/BANDAGES/DRESSINGS) IMPLANT
DEVICE DUBIN SPECIMEN MAMMOGRA (MISCELLANEOUS) ×3 IMPLANT
DRAPE CHEST BREAST 15X10 FENES (DRAPES) ×3 IMPLANT
DRAPE UTILITY 15X26 W/TAPE STR (DRAPE) ×6 IMPLANT
ELECT CAUTERY BLADE 6.4 (BLADE) ×3 IMPLANT
ELECT REM PT RETURN 9FT ADLT (ELECTROSURGICAL) ×3
ELECTRODE REM PT RTRN 9FT ADLT (ELECTROSURGICAL) ×1 IMPLANT
GLOVE BIO SURGEON STRL SZ8 (GLOVE) ×3 IMPLANT
GLOVE BIOGEL PI IND STRL 8 (GLOVE) ×1 IMPLANT
GLOVE BIOGEL PI INDICATOR 8 (GLOVE) ×2
GOWN STRL NON-REIN LRG LVL3 (GOWN DISPOSABLE) ×6 IMPLANT
GOWN STRL REIN XL XLG (GOWN DISPOSABLE) ×3 IMPLANT
KIT BASIN OR (CUSTOM PROCEDURE TRAY) ×3 IMPLANT
KIT MARKER MARGIN INK (KITS) IMPLANT
KIT ROOM TURNOVER OR (KITS) ×3 IMPLANT
NDL HYPO 25GX1X1/2 BEV (NEEDLE) ×1 IMPLANT
NEEDLE HYPO 25GX1X1/2 BEV (NEEDLE) ×3 IMPLANT
NS IRRIG 1000ML POUR BTL (IV SOLUTION) ×3 IMPLANT
PACK SURGICAL SETUP 50X90 (CUSTOM PROCEDURE TRAY) ×3 IMPLANT
PAD ARMBOARD 7.5X6 YLW CONV (MISCELLANEOUS) ×3 IMPLANT
PENCIL BUTTON HOLSTER BLD 10FT (ELECTRODE) ×3 IMPLANT
SPONGE LAP 4X18 X RAY DECT (DISPOSABLE) ×5 IMPLANT
SUT MON AB 4-0 PC3 18 (SUTURE) ×3 IMPLANT
SUT SILK 2 0 SH (SUTURE) IMPLANT
SUT VIC AB 3-0 SH 27 (SUTURE) ×6
SUT VIC AB 3-0 SH 27X BRD (SUTURE) IMPLANT
SUT VIC AB 3-0 SH 27XBRD (SUTURE) ×1 IMPLANT
SYR BULB 3OZ (MISCELLANEOUS) ×3 IMPLANT
SYR CONTROL 10ML LL (SYRINGE) ×3 IMPLANT
TOWEL OR 17X24 6PK STRL BLUE (TOWEL DISPOSABLE) ×3 IMPLANT
TOWEL OR 17X26 10 PK STRL BLUE (TOWEL DISPOSABLE) ×3 IMPLANT
TUBE CONNECTING 12'X1/4 (SUCTIONS)
TUBE CONNECTING 12X1/4 (SUCTIONS) IMPLANT
YANKAUER SUCT BULB TIP NO VENT (SUCTIONS) IMPLANT

## 2013-06-03 NOTE — H&P (View-Only) (Signed)
Patient ID: Michelle Mcneil, female   DOB: 05/14/1965, 48 y.o.   MRN: [id number]  Chief Complaint  Patient presents with  . New Evaluation    eval RT br fibroadneoma    HPI Michelle Mcneil is a 48 y.o. female.  Patient sent a request of Dr. Merrilyn Puma of the [place] for right breast mass. Patient underwent mammography and subsequent ultrasound due to mammographic abnormalities in both breasts. Patient found to have simple left breast cyst that was aspirated resolved. Patient has complex benign-appearing right breast mass. She was offered observation versus biopsy and wished to have it excised. She is here today to discuss that. Denies any symptoms of either breast. Denies any breast mass. HPI  Past Medical History  Diagnosis Date  . Adenomatous colon polyp     Past Surgical History  Procedure Laterality Date  . Wisdom tooth extraction  1993  . Colonoscopy  01/21/2012    Procedure: COLONOSCOPY;  Surgeon: Lafayette Dragon, MD;  Location: [facility];  Service: Endoscopy;  Laterality: N/A;    Family History  Problem Relation Age of Onset  . Colon polyps Mother   . Colon cancer Maternal Aunt   . Cancer Maternal Aunt     colon  . Cancer Sister     breast  . Cancer Son     osteosarcoma    Social History History  Substance Use Topics  . Smoking status: Never Smoker   . Smokeless tobacco: Never Used  . Alcohol Use: No    No Known Allergies  Current Outpatient Prescriptions  Medication Sig Dispense Refill  . Multiple Vitamins-Minerals (MULTIVITAMIN WITH MINERALS) tablet Take 1 tablet by mouth daily.       No current facility-administered medications for this visit.    Review of Systems Review of Systems  Constitutional: Negative for fever, chills and unexpected weight change.  HENT: Negative for congestion, hearing loss, sore throat, trouble swallowing and voice change.   Eyes: Negative for visual disturbance.  Respiratory: Negative for cough and wheezing.    Cardiovascular: Negative for chest pain, palpitations and leg swelling.  Gastrointestinal: Negative for nausea, vomiting, abdominal pain, diarrhea, constipation, blood in stool, abdominal distention and anal bleeding.  Genitourinary: Negative for hematuria, vaginal bleeding and difficulty urinating.  Musculoskeletal: Negative for arthralgias.  Skin: Negative for rash and wound.  Neurological: Negative for seizures, syncope and headaches.  Hematological: Negative for adenopathy. Does not bruise/bleed easily.  Psychiatric/Behavioral: Negative for confusion.    Blood pressure 116/80, pulse 60, temperature 98.4 F (36.9 C), temperature source Temporal, resp. rate 14, height 5\' 1"  (1.549 m), weight 174 lb (78.926 kg).  Physical Exam Physical Exam  Constitutional: She is oriented to person, place, and time. She appears well-developed and well-nourished.  HENT:  Head: Normocephalic and atraumatic.  Eyes: EOM are normal. Pupils are equal, round, and reactive to light.  Neck: Normal range of motion. Neck supple.  Cardiovascular: Normal rate and regular rhythm.   Pulmonary/Chest: Right breast exhibits no inverted nipple, no mass, no nipple discharge, no skin change and no tenderness. Left breast exhibits no inverted nipple, no mass, no nipple discharge, no skin change and no tenderness. Breasts are symmetrical.  Abdominal: Soft.  Musculoskeletal: Normal range of motion.  Neurological: She is alert and oriented to person, place, and time.  Skin: Skin is warm and dry.  Psychiatric: She has a normal mood and affect. Her behavior is normal. Judgment and thought content normal.    Data Reviewed  CLINICAL DATA: Suspected complex cysts versus probably benign solid  masses bilaterally on diagnostic evaluation for bilateral masses  seen mammographically performed earlier in the day  EXAM:  ULTRASOUND GUIDED left BREAST CYST ASPIRATION and attempted right  breast cyst aspiration  COMPARISON:  Bilateral breast ultrasound performed earlier in the  day  PROCEDURE:  Using sterile technique, 2% lidocaine, ultrasound guidance, and an  18 gauge needle, aspiration was performed of a complex cyst in the  left breast at 3 o'clock with complete resolution. 0.3 cc of  yellowish cyst fluid was obtained. Given its benign appearance, this  was not sent for cytologic evaluation.  Attempted aspiration of a suspected complex right breast cyst vs.  probably benign solid mass was undertaken using sterile technique,  2% lidocaine ultrasound guidance and an 18 gauge needle. The needle  was advanced with ultrasound guidance into the hypoechoic nodule  positioned at 10 o'clock on the right. No fluid could be aspirated  confirming that this represents a solid mass.  IMPRESSION:  Ultrasound-guided aspiration of left breast cyst with resulting  benign appearing cyst fluid. No apparent complications.  Attempted ultrasound-guided aspiration of a right breast hypoechoic  nodule with no fluid obtained. This confirms that this is a solid  lesion, most likely representing a fibroadenoma. This has  sonographic features compatible with a probably benign process.  RECOMMENDATION:  Given the solid nature of the probably benign mass on the right, we  discussed the possibility of ultrasound-guided core biopsy, surgical  excision for short-term sonographic followup in 6, 12 and 24 months.  The patient agreed with short-term sonographic evaluation with the  understanding that ultrasound-guided core biopsy can be pursued  should she desire prior to initial six-month evaluation.  BI-RADS: 3: Probably benign finding(s) - short interval follow-up  suggested.  Electronically Signed  By: Willadean Carol M.D.  On: 04/20/2013 12:02  Assessment    Right breast mass complex sclerosing  Simple left breast cyst resolved    Plan    Discussed options of observation, core biopsy versus excision of right breast mass. It  does have benign characteristics. The patient wishes excision of this. Discussed the pros and cons of this approach. Discussed lumpectomy and potential complications of this. She wishes to proceed with excision of right breast mass.The procedure has been discussed with the patient. Alternatives to surgery have been discussed with the patient.  Risks of surgery include bleeding,  Infection,  Seroma formation, death,  and the need for further surgery.   The patient understands and wishes to proceed.       Jody Silas A. 05/18/2013, 12:57 PM

## 2013-06-03 NOTE — Anesthesia Postprocedure Evaluation (Signed)
Anesthesia Post Note  Patient: Michelle Mcneil  Procedure(s) Performed: Procedure(s) (LRB): BREAST LUMPECTOMY WITH NEEDLE LOCALIZATION (Right)  Anesthesia type: general  Patient location: PACU  Post pain: Pain level controlled  Post assessment: Patient's Cardiovascular Status Stable  Post vital signs: Reviewed and stable  Level of consciousness: sedated  Complications: No apparent anesthesia complications

## 2013-06-03 NOTE — Discharge Instructions (Signed)
[facility] °Office Phone Number [phone] ° °BREAST BIOPSY/ PARTIAL MASTECTOMY: POST OP INSTRUCTIONS ° °Always review your discharge instruction sheet given to you by the facility where your surgery was performed. ° °IF YOU HAVE DISABILITY OR FAMILY LEAVE FORMS, YOU MUST BRING THEM TO THE OFFICE FOR PROCESSING.  DO NOT GIVE THEM TO YOUR DOCTOR. ° °1. A prescription for pain medication may be given to you upon discharge.  Take your pain medication as prescribed, if needed.  If narcotic pain medicine is not needed, then you may take acetaminophen (Tylenol) or ibuprofen (Advil) as needed. °2. Take your usually prescribed medications unless otherwise directed °3. If you need a refill on your pain medication, please contact your pharmacy.  They will contact our office to request authorization.  Prescriptions will not be filled after 5pm or on week-ends. °4. You should eat very light the first 24 hours after surgery, such as soup, crackers, pudding, etc.  Resume your normal diet the day after surgery. °5. Most patients will experience some swelling and bruising in the breast.  Ice packs and a good support bra will help.  Swelling and bruising can take several days to resolve.  °6. It is common to experience some constipation if taking pain medication after surgery.  Increasing fluid intake and taking a stool softener will usually help or prevent this problem from occurring.  A mild laxative (Milk of Magnesia or Miralax) should be taken according to package directions if there are no bowel movements after 48 hours. °7. Unless discharge instructions indicate otherwise, you may remove your bandages 24-48 hours after surgery, and you may shower at that time.  You may have steri-strips (small skin tapes) in place directly over the incision.  These strips should be left on the skin for 7-10 days.  If your surgeon used skin glue on the incision, you may shower in 24 hours.  The glue will flake off over the  next 2-3 weeks.  Any sutures or staples will be removed at the office during your follow-up visit. °8. ACTIVITIES:  You may resume regular daily activities (gradually increasing) beginning the next day.  Wearing a good support bra or sports bra minimizes pain and swelling.  You may have sexual intercourse when it is comfortable. °a. You may drive when you no longer are taking prescription pain medication, you can comfortably wear a seatbelt, and you can safely maneuver your car and apply brakes. °b. RETURN TO WORK:  ______________________________________________________________________________________ °9. You should see your doctor in the office for a follow-up appointment approximately two weeks after your surgery.  Your doctor’s nurse will typically make your follow-up appointment when she calls you with your pathology report.  Expect your pathology report 2-3 business days after your surgery.  You may call to check if you do not hear from us after three days. °10. OTHER INSTRUCTIONS: _______________________________________________________________________________________________ _____________________________________________________________________________________________________________________________________ °_____________________________________________________________________________________________________________________________________ °_____________________________________________________________________________________________________________________________________ ° °WHEN TO CALL YOUR DOCTOR: °1. Fever over 101.0 °2. Nausea and/or vomiting. °3. Extreme swelling or bruising. °4. Continued bleeding from incision. °5. Increased pain, redness, or drainage from the incision. ° °The clinic staff is available to answer your questions during regular business hours.  Please don’t hesitate to call and ask to speak to one of the nurses for clinical concerns.  If you have a medical emergency, go to the nearest  emergency room or call 911.  A surgeon from [facility] is always on call at the hospital. ° °For further questions, please visit [email or web address]  °

## 2013-06-03 NOTE — Op Note (Signed)
Right Breast Lumpectomy NEEDLE LOCALIZED  Indications: This patient presents with history of a right breast mass. Given the clinical history and physical exam, along with indicated diagnostic studies, breast biopsy will be performed.The procedure has been discussed with the patient. Alternatives to surgery have been discussed with the patient.  Risks of surgery include bleeding,  Infection,  Seroma formation, death,  and the need for further surgery.   The patient understands and wishes to proceed.  Pre-operative Diagnosis: right breast mass  Post-operative Diagnosis: right breast mass  Surgeon: Temeka Pore A.   Assistants: none  Anesthesia: General LMA anesthesia and Local anesthesia 0.25.% bupivacaine, with epinephrine  ASA Class: 2  Procedure Details  The patient was seen in the Holding Room. The risks, benefits, complications, treatment options, and expected outcomes were discussed with the patient. The possibilities of reaction to medication, pulmonary aspiration, bleeding, infection, the need for additional procedures, failure to diagnose a condition, and creating a complication requiring transfusion or operation were discussed with the patient. The patient concurred with the proposed plan, giving informed consent. The site of surgery properly noted/marked. The patient was taken to Operating Room, identified as Michelle Mcneil, and the procedure verified as lumpectomy. A Time Out was held and the above information confirmed.  After induction of anesthesia, the right breast and chest were prepped and draped in standard fashion. The lumpectomy was performed by creating an oblique incision over the upper outer quadrant of the breast where wire exited. Hemostasis was achieved with cautery. The wound was irrigated and closed with a 3-0 Vicryl and 4 O moncryl subcuticular closure in layers.     Sterile dressings were applied. At the end of the operation, all sponge, instrument, and needle  counts were correct.  Findings: grossly clear surgical margins  Estimated Blood Loss:  Minimal         Drains: none         Total IV Fluids: 300 mL         Specimens: breast mass right with wire            Complications:  None; patient tolerated the procedure well.         Disposition: PACU - hemodynamically stable.         Condition: Stable

## 2013-06-03 NOTE — Transfer of Care (Signed)
Immediate Anesthesia Transfer of Care Note  Patient: Michelle Mcneil  Procedure(s) Performed: Procedure(s): BREAST LUMPECTOMY WITH NEEDLE LOCALIZATION (Right)  Patient Location: PACU  Anesthesia Type:General  Level of Consciousness: awake, alert , oriented and patient cooperative  Airway & Oxygen Therapy: Patient Spontanous Breathing and Patient connected to nasal cannula oxygen  Post-op Assessment: Report given to PACU RN, Post -op Vital signs reviewed and stable and Patient moving all extremities  Post vital signs: Reviewed and stable  Complications: No apparent anesthesia complications

## 2013-06-03 NOTE — Anesthesia Preprocedure Evaluation (Addendum)
Anesthesia Evaluation  Patient identified by MRN, date of birth, ID band Patient awake    Reviewed: Allergy & Precautions, H&P , NPO status , Patient's Chart, lab work & pertinent test results  History of Anesthesia Complications Negative for: history of anesthetic complications  Airway Mallampati: I TM Distance: >3 FB   Mouth opening: Limited Mouth Opening  Dental  (+) Teeth Intact and Dental Advidsory Given   Pulmonary neg pulmonary ROS,  breath sounds clear to auscultation        Cardiovascular negative cardio ROS  Rhythm:regular Rate:Normal     Neuro/Psych negative neurological ROS  negative psych ROS   GI/Hepatic negative GI ROS, Neg liver ROS,   Endo/Other  negative endocrine ROS  Renal/GU negative Renal ROS     Musculoskeletal   Abdominal   Peds  Hematology   Anesthesia Other Findings   Reproductive/Obstetrics negative OB ROS                          Anesthesia Physical Anesthesia Plan  ASA: II  Anesthesia Plan: General LMA   Post-op Pain Management:    Induction:   Airway Management Planned:   Additional Equipment:   Intra-op Plan:   Post-operative Plan:   Informed Consent: I have reviewed the patients History and Physical, chart, labs and discussed the procedure including the risks, benefits and alternatives for the proposed anesthesia with the patient or authorized representative who has indicated his/her understanding and acceptance.   Dental Advisory Given  Plan Discussed with: Anesthesiologist, CRNA and Surgeon  Anesthesia Plan Comments:         Anesthesia Quick Evaluation

## 2013-06-03 NOTE — Interval H&P Note (Signed)
History and Physical Interval Note:  06/03/2013 8:18 AM  Michelle Mcneil  has presented today for surgery, with the diagnosis of right breast mass  The various methods of treatment have been discussed with the patient and family. After consideration of risks, benefits and other options for treatment, the patient has consented to  Procedure(s): BREAST LUMPECTOMY WITH NEEDLE LOCALIZATION (Right) as a surgical intervention .  The patient's history has been reviewed, patient examined, no change in status, stable for surgery.  I have reviewed the patient's chart and labs.  Questions were answered to the patient's satisfaction.     [name] A.

## 2013-06-05 NOTE — Addendum Note (Signed)
Addendum created 06/05/13 1734 by Duane Boston, MD   Modules edited: Anesthesia Attestations

## 2013-06-08 ENCOUNTER — Encounter (HOSPITAL_COMMUNITY): Payer: Self-pay | Admitting: Surgery

## 2013-06-09 ENCOUNTER — Telehealth (INDEPENDENT_AMBULATORY_CARE_PROVIDER_SITE_OTHER): Payer: Self-pay

## 2013-06-09 NOTE — Telephone Encounter (Signed)
Pt returned call and was given pathology report.

## 2013-06-09 NOTE — Telephone Encounter (Signed)
LMOM> path was benign.  

## 2013-06-09 NOTE — Telephone Encounter (Signed)
Message copied by Carlene Coria on Tue Jun 09, 2013  9:50 AM ------      Message from: Erroll Luna A      Created: Tue Jun 09, 2013  7:40 AM       benign ------

## 2013-06-15 ENCOUNTER — Ambulatory Visit (INDEPENDENT_AMBULATORY_CARE_PROVIDER_SITE_OTHER): Payer: 59 | Admitting: Surgery

## 2013-06-15 ENCOUNTER — Encounter (INDEPENDENT_AMBULATORY_CARE_PROVIDER_SITE_OTHER): Payer: Self-pay | Admitting: Surgery

## 2013-06-15 VITALS — BP 110/70 | HR 60 | Temp 98.5°F | Resp 14 | Ht 61.0 in | Wt 178.0 lb

## 2013-06-15 DIAGNOSIS — Z9889 Other specified postprocedural states: Secondary | ICD-10-CM

## 2013-06-15 NOTE — Progress Notes (Signed)
Patient returns after right breast lumpectomy. She has no complaints. Pathology showsBreast, lumpectomy, Right - BENIGN FIBROADENOMA, SEE COMMENT. - ONE INTRA-MAMMARY LYMPH NODE, NEGATIVE FOR TUMOR (0/1) - SURGICAL MARGIN, NEGATIVE FOR ATYPIA OR MALIGNANCY.   Exam: Mild swelling at right breast lumpectomy site. Small seroma noted. No redness or drainage. Not tender.   Impression: Status post right breast lumpectomy for fibroadenoma  Plan: Return as needed. Resume full activities tolerated. Mammogram next year. His

## 2013-06-15 NOTE — Patient Instructions (Signed)
RETURN AS NEEDED.  NO RESTRICTIONS. MAMMOGRAM  NEXT YEAR.          Fibroadenoma A fibroadenoma is a small, round, rubbery lump (tumor). The lump is not cancer. It often does not cause pain. It may move slightly when you touch it. This kind of lump can grow in one or both breasts. HOME CARE  Check your lump often for any changes.  Keep all follow-up exams and mammograms. GET HELP RIGHT AWAY IF:  The lump changes in size.  The lump becomes tender and painful.  You have fluid coming from your nipple. MAKE SURE YOU:  Understand these instructions.  Will watch your condition.  Will get help right away if you are not doing well or get worse. Document Released: 07/13/2008 Document Revised: 07/09/2011 Document Reviewed: 07/13/2008 [facility] Patient Information 2014 [place], [place].

## 2014-01-13 ENCOUNTER — Encounter: Payer: Self-pay | Admitting: Internal Medicine

## 2014-04-12 ENCOUNTER — Other Ambulatory Visit: Payer: Self-pay

## 2014-04-12 DIAGNOSIS — Z1231 Encounter for screening mammogram for malignant neoplasm of breast: Secondary | ICD-10-CM

## 2014-04-29 ENCOUNTER — Ambulatory Visit: Payer: 59

## 2014-05-03 ENCOUNTER — Ambulatory Visit
Admission: RE | Admit: 2014-05-03 | Discharge: 2014-05-03 | Disposition: A | Discharge status: Home / Self Care | Payer: 59 | Source: Ambulatory Visit

## 2014-05-03 DIAGNOSIS — Z1231 Encounter for screening mammogram for malignant neoplasm of breast: Secondary | ICD-10-CM

## 2014-05-04 ENCOUNTER — Other Ambulatory Visit: Payer: Self-pay | Admitting: Obstetrics and Gynecology

## 2014-05-04 DIAGNOSIS — R928 Other abnormal and inconclusive findings on diagnostic imaging of breast: Secondary | ICD-10-CM

## 2014-05-10 ENCOUNTER — Ambulatory Visit
Admission: RE | Admit: 2014-05-10 | Discharge: 2014-05-10 | Disposition: A | Discharge status: Home / Self Care | Payer: Self-pay | Source: Ambulatory Visit | Attending: Obstetrics and Gynecology | Admitting: Obstetrics and Gynecology

## 2014-05-10 ENCOUNTER — Other Ambulatory Visit: Payer: Self-pay | Admitting: Obstetrics and Gynecology

## 2014-05-10 DIAGNOSIS — R928 Other abnormal and inconclusive findings on diagnostic imaging of breast: Secondary | ICD-10-CM

## 2015-04-27 ENCOUNTER — Other Ambulatory Visit: Payer: Self-pay

## 2015-04-27 DIAGNOSIS — Z1231 Encounter for screening mammogram for malignant neoplasm of breast: Secondary | ICD-10-CM

## 2015-05-12 ENCOUNTER — Ambulatory Visit
Admission: RE | Admit: 2015-05-12 | Discharge: 2015-05-12 | Disposition: A | Discharge status: Home / Self Care | Payer: 59 | Source: Ambulatory Visit

## 2015-05-12 DIAGNOSIS — Z1231 Encounter for screening mammogram for malignant neoplasm of breast: Secondary | ICD-10-CM

## 2015-09-28 LAB — HM COLONOSCOPY

## 2015-12-01 ENCOUNTER — Telehealth: Payer: Self-pay | Admitting: Cardiovascular Disease

## 2015-12-01 NOTE — Telephone Encounter (Signed)
Received records from [facility] for appointment on 01/06/16 with Dr Oval Linsey.  Records given to [facility] (medical records) for Dr Blenda Mounts schedule on 01/06/16. lp

## 2016-01-05 NOTE — Progress Notes (Signed)
Cardiology Office Note   Date:  01/06/2016   ID:  Michelle Mcneil, DOB [birth year], MRN [id number]  PCP:  Marvene Staff, MD  Cardiologist:   Skeet Latch, MD   Chief Complaint  Patient presents with  . New Patient (Initial Visit)    previous sharp chest pain radiating down left arm.      History of Present Illness: Michelle Mcneil is a 50 y.o. female with no past medical history who presents for an evaluation of chest pain.  Michelle Mcneil saw Dr. Glendale Chard on 11/27/15 and reported chest pain.  One month ago she reported daily episodes of sharp, stabbing chest pain. The pain was 3-4 out of 10 in severity. It lasted for a few seconds at a time and occurred several times per day. It also radiated down her left arm. There is no associated shortness of breath, nausea, or diaphoresis. The episodes typically occurred at rest and not with exertion. She exercises by walking or running 3 miles per day. She has not noted any chest pain or shortness of breath with this activity. She does occasionally no episodes of lightheadedness and dizziness with positional changes. However, she has not noted any syncope. She reports that both her heart rate and her blood pressure has always been low.  Michelle Mcneil has not noted any lower extremity edema, orthopnea, or PND. She reports having a similar episode of chest pain in 2008, at which time she was diagnosed with costochondritis. She took ibuprofen with improvement of her symptoms. She has not had any increased physical activity recently.  Her mother had a heart attack at age 74, so she wanted to make sure that this pain was not due to her heart.   Michelle Mcneil  has started working on her diet recently. She is eating more fruits and vegetables in trying to drink more water. She is also tried to eat more lean meats and not eat fried foods.    Past Medical History:  Diagnosis Date  . Adenomatous colon polyp   . Atypical chest pain 01/06/2016  .  Bradycardia 01/06/2016  . Pneumonia    as a child    Past Surgical History:  Procedure Laterality Date  . BREAST LUMPECTOMY WITH NEEDLE LOCALIZATION Right 06/03/2013   Procedure: BREAST LUMPECTOMY WITH NEEDLE LOCALIZATION;  Surgeon: Marcello Moores A. Cornett, MD;  Location: [facility];  Service: General;  Laterality: Right;  . BREAST SURGERY    . cerclage stitch    . COLONOSCOPY  01/21/2012   Procedure: COLONOSCOPY;  Surgeon: Lafayette Dragon, MD;  Location: [facility];  Service: Endoscopy;  Laterality: N/A;  . WISDOM TOOTH EXTRACTION  1993     Current Outpatient Prescriptions  Medication Sig Dispense Refill  . calcium carbonate (OS-CAL - DOSED IN MG OF ELEMENTAL CALCIUM) 1250 MG tablet Take 1 tablet by mouth daily with breakfast.    . Methylsulfonylmethane (MSM PO) Take 1 tablet by mouth daily.    . Multiple Vitamins-Minerals (MULTIVITAMIN WITH MINERALS) tablet Take 1 tablet by mouth daily.     No current facility-administered medications for this visit.     Allergies:   Review of patient's allergies indicates no known allergies.    Social History:  The patient  reports that she has never smoked. She has never used smokeless tobacco. She reports that she does not drink alcohol or use drugs.   Family History:  The patient's family history includes Alzheimer's disease in her mother; Cancer in her maternal  aunt, sister, and son; Colon cancer in her maternal aunt; Colon polyps in her mother; Diabetes in her father; Heart attack in her mother; Hypertension in her father.    ROS:  Please see the history of present illness.   Otherwise, review of systems are positive for none.   All other systems are reviewed and negative.    PHYSICAL EXAM: VS:  BP 109/76   Pulse (!) 49   Ht 5' 1.5" (1.562 m)   Wt 183 lb 6.4 oz (83.2 kg)   BMI 34.09 kg/m  , BMI Body mass index is 34.09 kg/m. GENERAL:  Well appearing HEENT:  Pupils equal round and reactive, fundi not visualized, oral mucosa unremarkable NECK:   No jugular venous distention, waveform within normal limits, carotid upstroke brisk and symmetric, no bruits, no thyromegaly LYMPHATICS:  No cervical adenopathy LUNGS:  Clear to auscultation bilaterally HEART:  Bradycardic. Regular rhythmPMI not displaced or sustained,S1 and S2 within normal limits, no S3, no S4, no clicks, no rubs, no murmurs ABD:  Flat, positive bowel sounds normal in frequency in pitch, no bruits, no rebound, no guarding, no midline pulsatile mass, no hepatomegaly, no splenomegaly EXT:  2 plus pulses throughout, no edema, no cyanosis no clubbing SKIN:  No rashes no nodules NEURO:  Cranial nerves II through XII grossly intact, motor grossly intact throughout PSYCH:  Cognitively intact, oriented to person place and time  EKG:  EKG is ordered today. The ekg ordered today demonstrates sinus bradycardia. Rate 49 bpm. Low voltage precordial leads.   Recent Labs: No results found for requested labs within last 8760 hours.   11/21/15: TSH 1.54 WBC 8.2, hemoglobin 12.8, hematocrit 40.4, platelets 337 Sodium 139, potassium 4.6, BUN 11, creatinine 0.7 AST 16, ALT 8  Lipid Panel No results found for: CHOL, TRIG, HDL, CHOLHDL, VLDL, LDLCALC, LDLDIRECT    Wt Readings from Last 3 Encounters:  01/06/16 183 lb 6.4 oz (83.2 kg)  06/15/13 178 lb (80.7 kg)  05/27/13 175 lb 4.8 oz (79.5 kg)      ASSESSMENT AND PLAN:  # Atypical chest pain: Symptoms do not seem consistent with ischemia. She exercises daily without chest pain or shortness of breath and she has no risk factors other than family history. However, given that symptoms can present atypically we will refer her for ETT to evaluate for ischemia.  She reports that her lipids were recently checked by Dr. Baird Cancer and were normal.  I suspect that her symptoms are musculoskeleatal or related to esophageal spasm.  # Bradycardia: Asymtpomatic.  Continue to monitor.  Current medicines are reviewed at length with the patient  today.  The patient does not have concerns regarding medicines.  The following changes have been made:  no change  Labs/ tests ordered today include:   Orders Placed This Encounter  Procedures  . Exercise Tolerance Test  . EKG 12-Lead     Disposition:   FU with Kayleana Waites C. Oval Linsey, MD, [facility] as needed.     This note was written with the assistance of speech recognition software.  Please excuse any transcriptional errors.  Signed, Rajendra Spiller C. Oval Linsey, MD, [facility]  01/06/2016 9:24 AM     [facility]

## 2016-01-06 ENCOUNTER — Ambulatory Visit (INDEPENDENT_AMBULATORY_CARE_PROVIDER_SITE_OTHER): Payer: 59 | Admitting: Cardiovascular Disease

## 2016-01-06 ENCOUNTER — Encounter: Payer: Self-pay | Admitting: Cardiovascular Disease

## 2016-01-06 VITALS — BP 109/76 | HR 49 | Ht 61.5 in | Wt 183.4 lb

## 2016-01-06 DIAGNOSIS — R001 Bradycardia, unspecified: Secondary | ICD-10-CM | POA: Diagnosis not present

## 2016-01-06 DIAGNOSIS — R0789 Other chest pain: Secondary | ICD-10-CM

## 2016-01-06 HISTORY — DX: Other chest pain: R07.89

## 2016-01-06 HISTORY — DX: Bradycardia, unspecified: R00.1

## 2016-01-06 NOTE — Patient Instructions (Signed)
Medication Instructions:  Your physician recommends that you continue on your current medications as directed. Please refer to the Current Medication list given to you today.  Labwork: none  Testing/Procedures: Your physician has requested that you have an exercise tolerance test. For further information please visit [email or web address]. Please also follow instruction sheet, as given.  Follow-Up: As needed   If you need a refill on your cardiac medications before your next appointment, please call your pharmacy.   Exercise Stress Electrocardiogram An exercise stress electrocardiogram is a test to check how blood flows to your heart. It is done to find areas of poor blood flow. You will need to walk on a treadmill for this test. The electrocardiogram will record your heartbeat when you are at rest and when you are exercising. BEFORE THE PROCEDURE  Do not have drinks with caffeine or foods with caffeine for 24 hours before the test, or as told by your doctor. This includes coffee, tea (even decaf tea), sodas, chocolate, and cocoa.  Follow your doctor's instructions about eating and drinking before the test.  Ask your doctor what medicines you should or should not take before the test. Take your medicines with water unless told by your doctor not to.  If you use an inhaler, bring it with you to the test.  Bring a snack to eat after the test.  Do not  smoke for 4 hours before the test.  Do not put lotions, powders, creams, or oils on your chest before the test.  Wear comfortable shoes and clothing. PROCEDURE  You will have patches put on your chest. Small areas of your chest may need to be shaved. Wires will be connected to the patches.  Your heart rate will be watched while you are resting and while you are exercising.  You will walk on the treadmill. The treadmill will slowly get faster to raise your heart rate.  The test will take about 1-2 hours. AFTER THE PROCEDURE  Your  heart rate and blood pressure will be watched after the test.  You may return to your normal diet, activities, and medicines or as told by your doctor.   This information is not intended to replace advice given to you by your health care provider. Make sure you discuss any questions you have with your health care provider.   Document Released: 10/03/2007 Document Revised: 05/07/2014 Document Reviewed: 12/22/2012 Elsevier Interactive Patient Education [employer].

## 2016-01-10 ENCOUNTER — Telehealth (HOSPITAL_COMMUNITY): Payer: Self-pay

## 2016-01-10 NOTE — Telephone Encounter (Signed)
Encounter complete. 

## 2016-01-11 ENCOUNTER — Ambulatory Visit (HOSPITAL_COMMUNITY)
Admission: RE | Admit: 2016-01-11 | Discharge: 2016-01-11 | Disposition: A | Discharge status: Home / Self Care | Payer: 59 | Source: Ambulatory Visit | Attending: Cardiovascular Disease | Admitting: Cardiovascular Disease

## 2016-01-11 DIAGNOSIS — R0789 Other chest pain: Secondary | ICD-10-CM | POA: Diagnosis not present

## 2016-01-11 LAB — EXERCISE TOLERANCE TEST
CHL CUP MPHR: 170 {beats}/min
CSEPHR: 96 %
CSEPPHR: 164 {beats}/min
Estimated workload: 12.6 METS
Exercise duration (min): 10 min
Exercise duration (sec): 33 s
RPE: 17
Rest HR: 62 {beats}/min

## 2016-05-03 DIAGNOSIS — Z0000 Encounter for general adult medical examination without abnormal findings: Secondary | ICD-10-CM | POA: Diagnosis not present

## 2016-05-03 DIAGNOSIS — Z131 Encounter for screening for diabetes mellitus: Secondary | ICD-10-CM | POA: Diagnosis not present

## 2016-05-03 DIAGNOSIS — Z01419 Encounter for gynecological examination (general) (routine) without abnormal findings: Secondary | ICD-10-CM | POA: Diagnosis not present

## 2016-05-03 DIAGNOSIS — Z13220 Encounter for screening for lipoid disorders: Secondary | ICD-10-CM | POA: Diagnosis not present

## 2016-07-23 ENCOUNTER — Other Ambulatory Visit: Payer: Self-pay | Admitting: Internal Medicine

## 2016-07-23 DIAGNOSIS — Z0100 Encounter for examination of eyes and vision without abnormal findings: Secondary | ICD-10-CM | POA: Diagnosis not present

## 2016-07-23 DIAGNOSIS — Z1231 Encounter for screening mammogram for malignant neoplasm of breast: Secondary | ICD-10-CM

## 2016-08-09 ENCOUNTER — Ambulatory Visit
Admission: RE | Admit: 2016-08-09 | Discharge: 2016-08-09 | Disposition: A | Discharge status: Home / Self Care | Payer: 59 | Source: Ambulatory Visit | Attending: Internal Medicine | Admitting: Internal Medicine

## 2016-08-09 ENCOUNTER — Ambulatory Visit: Payer: 59

## 2016-08-09 DIAGNOSIS — Z1231 Encounter for screening mammogram for malignant neoplasm of breast: Secondary | ICD-10-CM

## 2016-08-28 DIAGNOSIS — Z23 Encounter for immunization: Secondary | ICD-10-CM | POA: Diagnosis not present

## 2016-08-28 DIAGNOSIS — Z0000 Encounter for general adult medical examination without abnormal findings: Secondary | ICD-10-CM | POA: Diagnosis not present

## 2017-01-01 ENCOUNTER — Other Ambulatory Visit: Payer: Self-pay | Admitting: Gastroenterology

## 2017-01-01 DIAGNOSIS — K219 Gastro-esophageal reflux disease without esophagitis: Secondary | ICD-10-CM | POA: Diagnosis not present

## 2017-01-01 DIAGNOSIS — Z800 Family history of malignant neoplasm of digestive organs: Secondary | ICD-10-CM | POA: Diagnosis not present

## 2017-01-01 DIAGNOSIS — R1310 Dysphagia, unspecified: Secondary | ICD-10-CM | POA: Diagnosis not present

## 2017-01-01 NOTE — Progress Notes (Signed)
Michelle Amble MD 

## 2017-01-09 ENCOUNTER — Ambulatory Visit
Admission: RE | Admit: 2017-01-09 | Discharge: 2017-01-09 | Disposition: A | Discharge status: Home / Self Care | Payer: 59 | Source: Ambulatory Visit | Attending: Gastroenterology | Admitting: Gastroenterology

## 2017-01-09 DIAGNOSIS — R1310 Dysphagia, unspecified: Secondary | ICD-10-CM

## 2017-01-09 DIAGNOSIS — K219 Gastro-esophageal reflux disease without esophagitis: Secondary | ICD-10-CM | POA: Diagnosis not present

## 2017-02-28 DIAGNOSIS — K146 Glossodynia: Secondary | ICD-10-CM | POA: Diagnosis not present

## 2017-02-28 DIAGNOSIS — K219 Gastro-esophageal reflux disease without esophagitis: Secondary | ICD-10-CM | POA: Diagnosis not present

## 2017-03-28 DIAGNOSIS — K146 Glossodynia: Secondary | ICD-10-CM | POA: Diagnosis not present

## 2017-03-28 DIAGNOSIS — K219 Gastro-esophageal reflux disease without esophagitis: Secondary | ICD-10-CM | POA: Diagnosis not present

## 2017-08-07 ENCOUNTER — Other Ambulatory Visit: Payer: Self-pay | Admitting: Obstetrics and Gynecology

## 2017-08-07 DIAGNOSIS — Z1231 Encounter for screening mammogram for malignant neoplasm of breast: Secondary | ICD-10-CM

## 2017-08-14 ENCOUNTER — Ambulatory Visit: Payer: 59

## 2017-08-15 ENCOUNTER — Ambulatory Visit
Admission: RE | Admit: 2017-08-15 | Discharge: 2017-08-15 | Disposition: A | Discharge status: Home / Self Care | Payer: 59 | Source: Ambulatory Visit | Attending: Obstetrics and Gynecology | Admitting: Obstetrics and Gynecology

## 2017-08-15 DIAGNOSIS — Z1231 Encounter for screening mammogram for malignant neoplasm of breast: Secondary | ICD-10-CM | POA: Diagnosis not present

## 2017-10-14 DIAGNOSIS — Z0000 Encounter for general adult medical examination without abnormal findings: Secondary | ICD-10-CM | POA: Diagnosis not present

## 2017-10-14 DIAGNOSIS — Z1389 Encounter for screening for other disorder: Secondary | ICD-10-CM | POA: Diagnosis not present

## 2017-12-23 DIAGNOSIS — H0019 Chalazion unspecified eye, unspecified eyelid: Secondary | ICD-10-CM | POA: Diagnosis not present

## 2017-12-26 DIAGNOSIS — H0011 Chalazion right upper eyelid: Secondary | ICD-10-CM | POA: Diagnosis not present

## 2018-01-09 DIAGNOSIS — H0011 Chalazion right upper eyelid: Secondary | ICD-10-CM | POA: Diagnosis not present

## 2018-02-03 DIAGNOSIS — Z6836 Body mass index (BMI) 36.0-36.9, adult: Secondary | ICD-10-CM | POA: Diagnosis not present

## 2018-02-03 DIAGNOSIS — Z01419 Encounter for gynecological examination (general) (routine) without abnormal findings: Secondary | ICD-10-CM | POA: Diagnosis not present

## 2018-02-12 DIAGNOSIS — N939 Abnormal uterine and vaginal bleeding, unspecified: Secondary | ICD-10-CM | POA: Diagnosis not present

## 2018-02-19 ENCOUNTER — Other Ambulatory Visit: Payer: Self-pay

## 2018-02-19 ENCOUNTER — Encounter (HOSPITAL_COMMUNITY): Payer: Self-pay | Admitting: *Deleted

## 2018-02-21 ENCOUNTER — Other Ambulatory Visit: Payer: Self-pay | Admitting: Obstetrics and Gynecology

## 2018-03-10 ENCOUNTER — Other Ambulatory Visit: Payer: Self-pay

## 2018-03-10 ENCOUNTER — Encounter (HOSPITAL_COMMUNITY): Payer: Self-pay

## 2018-03-10 ENCOUNTER — Ambulatory Visit (HOSPITAL_COMMUNITY): Payer: 59 | Admitting: Anesthesiology

## 2018-03-10 ENCOUNTER — Ambulatory Visit (HOSPITAL_COMMUNITY)
Admission: RE | Admit: 2018-03-10 | Discharge: 2018-03-10 | Disposition: A | Discharge status: Home / Self Care | Payer: 59 | Source: Ambulatory Visit | Attending: Obstetrics and Gynecology | Admitting: Obstetrics and Gynecology

## 2018-03-10 ENCOUNTER — Encounter (HOSPITAL_COMMUNITY)
Admission: RE | Disposition: A | Discharge status: Home / Self Care | Payer: Self-pay | Source: Ambulatory Visit | Attending: Obstetrics and Gynecology

## 2018-03-10 DIAGNOSIS — K219 Gastro-esophageal reflux disease without esophagitis: Secondary | ICD-10-CM | POA: Diagnosis not present

## 2018-03-10 DIAGNOSIS — Z79899 Other long term (current) drug therapy: Secondary | ICD-10-CM | POA: Diagnosis not present

## 2018-03-10 DIAGNOSIS — N939 Abnormal uterine and vaginal bleeding, unspecified: Secondary | ICD-10-CM | POA: Insufficient documentation

## 2018-03-10 DIAGNOSIS — N858 Other specified noninflammatory disorders of uterus: Secondary | ICD-10-CM | POA: Diagnosis not present

## 2018-03-10 DIAGNOSIS — N840 Polyp of corpus uteri: Secondary | ICD-10-CM | POA: Diagnosis not present

## 2018-03-10 DIAGNOSIS — R9389 Abnormal findings on diagnostic imaging of other specified body structures: Secondary | ICD-10-CM | POA: Diagnosis not present

## 2018-03-10 HISTORY — DX: Gastro-esophageal reflux disease without esophagitis: K21.9

## 2018-03-10 HISTORY — PX: DILATATION & CURETTAGE/HYSTEROSCOPY WITH MYOSURE: SHX6511

## 2018-03-10 HISTORY — DX: Anemia, unspecified: D64.9

## 2018-03-10 LAB — CBC
HCT: 37 % (ref 36.0–46.0)
Hemoglobin: 12 g/dL (ref 12.0–15.0)
MCH: 32.1 pg (ref 26.0–34.0)
MCHC: 32.4 g/dL (ref 30.0–36.0)
MCV: 98.9 fL (ref 80.0–100.0)
NRBC: 0 % (ref 0.0–0.2)
PLATELETS: 278 10*3/uL (ref 150–400)
RBC: 3.74 MIL/uL — ABNORMAL LOW (ref 3.87–5.11)
RDW: 14.2 % (ref 11.5–15.5)
WBC: 8.1 10*3/uL (ref 4.0–10.5)

## 2018-03-10 SURGERY — DILATATION & CURETTAGE/HYSTEROSCOPY WITH MYOSURE
Anesthesia: General

## 2018-03-10 MED ORDER — LIDOCAINE HCL (CARDIAC) PF 100 MG/5ML IV SOSY
PREFILLED_SYRINGE | INTRAVENOUS | Status: DC | PRN
  Administered 2018-03-10: 100 mg via INTRAVENOUS

## 2018-03-10 MED ORDER — EPHEDRINE 5 MG/ML INJ
INTRAVENOUS | Status: AC
  Filled 2018-03-10: qty 10

## 2018-03-10 MED ORDER — KETOROLAC TROMETHAMINE 30 MG/ML IJ SOLN
INTRAMUSCULAR | Status: AC
  Administered 2018-03-10: 30 mg via INTRAMUSCULAR
  Filled 2018-03-10: qty 1

## 2018-03-10 MED ORDER — SODIUM CHLORIDE 0.9 % IR SOLN
Status: DC | PRN
  Administered 2018-03-10: 3000 mL

## 2018-03-10 MED ORDER — SCOPOLAMINE 1 MG/3DAYS TD PT72
1.0000 | MEDICATED_PATCH | Freq: Once | TRANSDERMAL | Status: DC
  Administered 2018-03-10: 1.5 mg via TRANSDERMAL

## 2018-03-10 MED ORDER — KETOROLAC TROMETHAMINE 30 MG/ML IJ SOLN
INTRAMUSCULAR | Status: DC | PRN
  Administered 2018-03-10: 30 mg via INTRAVENOUS

## 2018-03-10 MED ORDER — KETOROLAC TROMETHAMINE 30 MG/ML IJ SOLN
INTRAMUSCULAR | Status: AC
  Filled 2018-03-10: qty 1

## 2018-03-10 MED ORDER — FENTANYL CITRATE (PF) 100 MCG/2ML IJ SOLN
INTRAMUSCULAR | Status: DC | PRN
  Administered 2018-03-10: 25 ug via INTRAVENOUS

## 2018-03-10 MED ORDER — ONDANSETRON HCL 4 MG/2ML IJ SOLN
INTRAMUSCULAR | Status: DC | PRN
  Administered 2018-03-10: 4 mg via INTRAVENOUS

## 2018-03-10 MED ORDER — MIDAZOLAM HCL 2 MG/2ML IJ SOLN
INTRAMUSCULAR | Status: AC
  Filled 2018-03-10: qty 2

## 2018-03-10 MED ORDER — DEXAMETHASONE SODIUM PHOSPHATE 10 MG/ML IJ SOLN
INTRAMUSCULAR | Status: DC | PRN
  Administered 2018-03-10: 4 mg via INTRAVENOUS

## 2018-03-10 MED ORDER — ONDANSETRON HCL 4 MG/2ML IJ SOLN
INTRAMUSCULAR | Status: AC
  Filled 2018-03-10: qty 2

## 2018-03-10 MED ORDER — EPHEDRINE SULFATE 50 MG/ML IJ SOLN
INTRAMUSCULAR | Status: DC | PRN
  Administered 2018-03-10: 5 mg via INTRAVENOUS

## 2018-03-10 MED ORDER — FENTANYL CITRATE (PF) 100 MCG/2ML IJ SOLN
INTRAMUSCULAR | Status: AC
  Filled 2018-03-10: qty 2

## 2018-03-10 MED ORDER — PROMETHAZINE HCL 25 MG/ML IJ SOLN: 6.2500 mg | INTRAMUSCULAR | Status: DC | PRN

## 2018-03-10 MED ORDER — OXYCODONE HCL 5 MG PO TABS: 5.0000 mg | ORAL_TABLET | Freq: Once | ORAL | Status: DC | PRN

## 2018-03-10 MED ORDER — LIDOCAINE HCL 1 % IJ SOLN
INTRAMUSCULAR | Status: AC
  Filled 2018-03-10: qty 20

## 2018-03-10 MED ORDER — MIDAZOLAM HCL 2 MG/2ML IJ SOLN
INTRAMUSCULAR | Status: DC | PRN
  Administered 2018-03-10: 2 mg via INTRAVENOUS

## 2018-03-10 MED ORDER — LACTATED RINGERS IV SOLN
INTRAVENOUS | Status: DC
  Administered 2018-03-10: 125 mL/h via INTRAVENOUS

## 2018-03-10 MED ORDER — PROPOFOL 10 MG/ML IV BOLUS
INTRAVENOUS | Status: AC
  Filled 2018-03-10: qty 20

## 2018-03-10 MED ORDER — KETOROLAC TROMETHAMINE 30 MG/ML IJ SOLN
30.0000 mg | Freq: Once | INTRAMUSCULAR | Status: AC
  Administered 2018-03-10: 30 mg via INTRAMUSCULAR

## 2018-03-10 MED ORDER — LIDOCAINE HCL (CARDIAC) PF 100 MG/5ML IV SOSY
PREFILLED_SYRINGE | INTRAVENOUS | Status: AC
  Filled 2018-03-10: qty 5

## 2018-03-10 MED ORDER — HYDROMORPHONE HCL 1 MG/ML IJ SOLN: 0.2500 mg | INTRAMUSCULAR | Status: DC | PRN

## 2018-03-10 MED ORDER — DEXAMETHASONE SODIUM PHOSPHATE 4 MG/ML IJ SOLN
INTRAMUSCULAR | Status: AC
  Filled 2018-03-10: qty 1

## 2018-03-10 MED ORDER — OXYCODONE HCL 5 MG/5ML PO SOLN: 5.0000 mg | Freq: Once | ORAL | Status: DC | PRN

## 2018-03-10 MED ORDER — PROPOFOL 10 MG/ML IV BOLUS
INTRAVENOUS | Status: DC | PRN
  Administered 2018-03-10: 50 mg via INTRAVENOUS
  Administered 2018-03-10: 150 mg via INTRAVENOUS

## 2018-03-10 MED ORDER — SCOPOLAMINE 1 MG/3DAYS TD PT72
MEDICATED_PATCH | TRANSDERMAL | Status: AC
  Administered 2018-03-10: 1.5 mg via TRANSDERMAL
  Filled 2018-03-10: qty 1

## 2018-03-10 MED ORDER — MEPERIDINE HCL 25 MG/ML IJ SOLN: 6.2500 mg | INTRAMUSCULAR | Status: DC | PRN

## 2018-03-10 SURGICAL SUPPLY — 15 items
CANISTER SUCT 3000ML PPV (MISCELLANEOUS) ×3 IMPLANT
CATH ROBINSON RED A/P 16FR (CATHETERS) ×3 IMPLANT
DEVICE MYOSURE LITE (MISCELLANEOUS) ×2 IMPLANT
DEVICE MYOSURE REACH (MISCELLANEOUS) IMPLANT
FILTER ARTHROSCOPY CONVERTOR (FILTER) ×3 IMPLANT
GLOVE BIOGEL PI IND STRL 7.0 (GLOVE) ×2 IMPLANT
GLOVE BIOGEL PI INDICATOR 7.0 (GLOVE) ×4
GLOVE ECLIPSE 6.5 STRL STRAW (GLOVE) ×3 IMPLANT
GOWN STRL REUS W/TWL LRG LVL3 (GOWN DISPOSABLE) ×6 IMPLANT
PACK VAGINAL MINOR WOMEN LF (CUSTOM PROCEDURE TRAY) ×3 IMPLANT
PAD OB MATERNITY 4.3X12.25 (PERSONAL CARE ITEMS) ×3 IMPLANT
SEAL ROD LENS SCOPE MYOSURE (ABLATOR) ×3 IMPLANT
TOWEL OR 17X24 6PK STRL BLUE (TOWEL DISPOSABLE) ×6 IMPLANT
TUBING AQUILEX INFLOW (TUBING) ×3 IMPLANT
TUBING AQUILEX OUTFLOW (TUBING) ×3 IMPLANT

## 2018-03-10 NOTE — Brief Op Note (Signed)
03/10/2018  8:18 AM  PATIENT:  Michelle Mcneil  52 y.o. female  PRE-OPERATIVE DIAGNOSIS:  Abnormal Uterine Bleeding, Endometrial Mass  POST-OPERATIVE DIAGNOSIS:  Abnormal Uterine Bleeding, Endometrial Mass  PROCEDURE:  Diagnostic hysteroscopy, D&C, hysteroscopic resection of endometrial thickening  SURGEON:  Surgeon(s) and Role:    Servando Salina, MD - Primary  PHYSICIAN ASSISTANT:   ASSISTANTS: none   ANESTHESIA:   none Finding: thickening endometrium posterior wall , nl endocervical canal EBL:  5 mL   BLOOD ADMINISTERED:none  DRAINS: none   LOCAL MEDICATIONS USED:  NONE  SPECIMEN:  Source of Specimen:  EMC ? polyp  DISPOSITION OF SPECIMEN:  PATHOLOGY  COUNTS:  YES  TOURNIQUET:  * No tourniquets in log *  DICTATION: .Other Dictation: Dictation Number [id number]  PLAN OF CARE: Discharge to home after PACU  PATIENT DISPOSITION:  PACU - hemodynamically stable.   Delay start of Pharmacological VTE agent (>24hrs) due to surgical blood loss or risk of bleeding: no

## 2018-03-10 NOTE — H&P (Signed)
Michelle Mcneil is an 52 y.o. female G55P2 Married female presents for surgical mgmt of AUB , endometrial mass( 1.9 cm ? Endometrial polyp) noted on sono hysterogram  Pertinent Gynecological History: Menses: irreg Bleeding: dysfunctional uterine bleeding Contraception: tubal ligation DES exposure: denies Blood transfusions: none Sexually transmitted diseases: no past history Previous GYN Procedures: TL  Last mammogram: normal Date: 08/15/2017 Last pap: normal Date: 02/03/2018 OB History: G3, P2  Menstrual History: Menarche age: n/a Patient's last menstrual period was 02/01/2018 (approximate).    Past Medical History:  Diagnosis Date  . Adenomatous colon polyp   . Anemia   . Atypical chest pain 01/06/2016   saw cardiologist - EKG normal-brady, ETT Normal  . Bradycardia 01/06/2016  . GERD (gastroesophageal reflux disease)    no meds, diet controlled  . Pneumonia    as a child  . SVD (spontaneous vaginal delivery)    x 2    Past Surgical History:  Procedure Laterality Date  . BREAST EXCISIONAL BIOPSY Right 06/03/2013  . BREAST LUMPECTOMY WITH NEEDLE LOCALIZATION Right 06/03/2013   Procedure: BREAST LUMPECTOMY WITH NEEDLE LOCALIZATION;  Surgeon: Marcello Moores A. Cornett, MD;  Location: [facility];  Service: General;  Laterality: Right;  . BREAST SURGERY Right    lumpectomy  . cerclage stitch    . COLONOSCOPY  01/21/2012   Procedure: COLONOSCOPY;  Surgeon: Lafayette Dragon, MD;  Location: [facility];  Service: Endoscopy;  Laterality: N/A;  . EYE SURGERY Bilateral    Lasik  . HAND SURGERY Right    trigger finer - thumb  . TUBAL LIGATION    . UPPER GI ENDOSCOPY    . WISDOM TOOTH EXTRACTION  1993    Family History  Problem Relation Age of Onset  . Colon polyps Mother   . Alzheimer's disease Mother   . Heart attack Mother   . Colon cancer Maternal Aunt   . Cancer Maternal Aunt        colon  . Diabetes Father   . Hypertension Father   . Cancer Sister   . Breast cancer Sister   .  Cancer Son        osteosarcoma    Social History:  reports that she has never smoked. She has never used smokeless tobacco. She reports that she does not drink alcohol or use drugs.  Allergies: No Known Allergies  Medications Prior to Admission  Medication Sig Dispense Refill Last Dose  . acetaminophen (TYLENOL) 500 MG tablet Take 500 mg by mouth every 6 (six) hours as needed (for pain.).   Past Month at Unknown time  . BLACK CURRANT SEED OIL PO Take 1,000 mg by mouth daily.   Past Month at Unknown time  . Cholecalciferol (VITAMIN D-3) 5000 units TABS Take 5,000 Units by mouth daily.   Past Month at Unknown time  . ibuprofen (ADVIL,MOTRIN) 200 MG tablet Take 400 mg by mouth every 8 (eight) hours as needed (for pain.).   Past Month at Unknown time  . Methylsulfonylmethane (MSM PO) Take 1 tablet by mouth daily.   Past Month at Unknown time  . Multiple Vitamins-Minerals (MULTIVITAMIN WITH MINERALS) tablet Take 1 tablet by mouth every 14 (fourteen) days. Once every 2 weeks.   Past Month at Unknown time    Review of Systems  All other systems reviewed and are negative.   Blood pressure 120/90, pulse 62, temperature (!) 97.2 F (36.2 C), temperature source Oral, resp. rate 16, height 5\' 1"  (1.549 m), weight 87.5 kg, last  menstrual period 02/01/2018, SpO2 100 %. Physical Exam  Constitutional: She is oriented to person, place, and time. She appears well-developed and well-nourished.  HENT:  Head: Atraumatic.  Eyes: EOM are normal.  Neck: Neck supple.  Cardiovascular: Regular rhythm.  Respiratory: Breath sounds normal.  Genitourinary:  Genitourinary Comments: Cervix parous, nl Adnexa nl uterus nl Vagina nl  Musculoskeletal: Normal range of motion.  Neurological: She is alert and oriented to person, place, and time.  Skin: Skin is warm and dry.  Psychiatric: She has a normal mood and affect.    Results for orders placed or performed during the hospital encounter of 03/10/18 (from the  past 24 hour(s))  CBC     Status: Abnormal   Collection Time: 03/10/18  6:15 AM  Result Value Ref Range   WBC 8.1 4.0 - 10.5 K/uL   RBC 3.74 (L) 3.87 - 5.11 MIL/uL   Hemoglobin 12.0 12.0 - 15.0 g/dL   HCT 37.0 36.0 - 46.0 %   MCV 98.9 80.0 - 100.0 fL   MCH 32.1 26.0 - 34.0 pg   MCHC 32.4 30.0 - 36.0 g/dL   RDW 14.2 11.5 - 15.5 %   Platelets 278 150 - 400 K/uL   nRBC 0.0 0.0 - 0.2 %    No results found.  Assessment/Plan: AUB  endometrial mass P) dx hysteroscopy, hysteroscopic resection of endometrial mass, D&C.  Risk of surgery reviewed including infection, bleeding, uterine perforation (04/998) and its risk, thermal injury , fluid overload and its mgmt, injury to surrounding organ structures. ALL ? answered  Michelle Mcneil 03/10/2018, 6:49 AM

## 2018-03-10 NOTE — Anesthesia Procedure Notes (Signed)
Procedure Name: LMA Insertion Date/Time: 03/10/2018 7:26 AM Performed by: Hewitt Blade, CRNA Pre-anesthesia Checklist: Patient identified, Emergency Drugs available, Suction available and Patient being monitored Patient Re-evaluated:Patient Re-evaluated prior to induction Oxygen Delivery Method: Circle system utilized Preoxygenation: Pre-oxygenation with 100% oxygen Induction Type: IV induction Ventilation: Mask ventilation without difficulty LMA Size: 4.0 Number of attempts: 1 Placement Confirmation: positive ETCO2 and breath sounds checked- equal and bilateral Tube secured with: Tape Dental Injury: Teeth and Oropharynx as per pre-operative assessment

## 2018-03-10 NOTE — Discharge Instructions (Signed)
CALL  IF TEMP>100.4, NOTHING PER VAGINA X 1WK, CALL IF SOAKING A MAXI  PAD EVERY HOUR OR MORE FREQUENTLY   Post Anesthesia Home Care Instructions  Activity: Get plenty of rest for the remainder of the day. A responsible individual must stay with you for 24 hours following the procedure.  For the next 24 hours, DO NOT: -Drive a car -Paediatric nurse -Drink alcoholic beverages -Take any medication unless instructed by your physician -Make any legal decisions or sign important papers.  Meals: Start with liquid foods such as gelatin or soup. Progress to regular foods as tolerated. Avoid greasy, spicy, heavy foods. If nausea and/or vomiting occur, drink only clear liquids until the nausea and/or vomiting subsides. Call your physician if vomiting continues.  Special Instructions/Symptoms: Your throat may feel dry or sore from the anesthesia or the breathing tube placed in your throat during surgery. If this causes discomfort, gargle with warm salt water. The discomfort should disappear within 24 hours.  If you had a scopolamine patch placed behind your ear for the management of post- operative nausea and/or vomiting:  1. The medication in the patch is effective for 72 hours, after which it should be removed.  Wrap patch in a tissue and discard in the trash. Wash hands thoroughly with soap and water. 2. You may remove the patch earlier than 72 hours if you experience unpleasant side effects which may include dry mouth, dizziness or visual disturbances. 3. Avoid touching the patch. Wash your hands with soap and water after contact with the patch.    DISCHARGE INSTRUCTIONS: D&C / D&E The following instructions have been prepared to help you care for yourself upon your return home.   Personal hygiene:  Use sanitary pads for vaginal drainage, not tampons.  Shower the day after your procedure.  NO tub baths, pools or Jacuzzis for 2-3 weeks.  Wipe front to back after using the  bathroom.  Activity and limitations:  Do NOT drive or operate any equipment for 24 hours. The effects of anesthesia are still present and drowsiness may result.  Do NOT rest in bed all day.  Walking is encouraged.  Walk up and down stairs slowly.  You may resume your normal activity in one to two days or as indicated by your physician.  Sexual activity: NO intercourse for at least 2 weeks after the procedure, or as indicated by your physician.  Diet: Eat a light meal as desired this evening. You may resume your usual diet tomorrow.  Return to work: You may resume your work activities in one to two days or as indicated by your doctor.  What to expect after your surgery: Expect to have vaginal bleeding/discharge for 2-3 days and spotting for up to 10 days. It is not unusual to have soreness for up to 1-2 weeks. You may have a slight burning sensation when you urinate for the first day. Mild cramps may continue for a couple of days. You may have a regular period in 2-6 weeks.  Call your doctor for any of the following:  Excessive vaginal bleeding, saturating and changing one pad every hour.  Inability to urinate 6 hours after discharge from hospital.  Pain not relieved by pain medication.  Fever of 100.4 F or greater.  Unusual vaginal discharge or odor.

## 2018-03-10 NOTE — Transfer of Care (Signed)
Immediate Anesthesia Transfer of Care Note  Patient: MARISOL GIAMBRA  Procedure(s) Performed: DILATATION & CURETTAGE/HYSTEROSCOPY WITH MYOSURE (N/A )  Patient Location: PACU  Anesthesia Type:General  Level of Consciousness: awake, alert  and oriented  Airway & Oxygen Therapy: Patient Spontanous Breathing and Patient connected to nasal cannula oxygen  Post-op Assessment: Report given to RN, Post -op Vital signs reviewed and stable and Patient moving all extremities  Post vital signs: Reviewed and stable  Last Vitals:  Vitals Value Taken Time  BP    Temp    Pulse    Resp    SpO2      Last Pain:  Vitals:   03/10/18 0617  TempSrc: Oral  PainSc: 0-No pain      Patients Stated Pain Goal: 5 (68/40/33 5331)  Complications: No apparent anesthesia complications

## 2018-03-10 NOTE — Anesthesia Preprocedure Evaluation (Signed)
Anesthesia Evaluation  Patient identified by MRN, date of birth, ID band Patient awake    Reviewed: Allergy & Precautions, H&P , NPO status , Patient's Chart, lab work & pertinent test results  History of Anesthesia Complications Negative for: history of anesthetic complications  Airway Mallampati: I  TM Distance: >3 FB   Mouth opening: Limited Mouth Opening  Dental  (+) Teeth Intact, Dental Advidsory Given   Pulmonary neg pulmonary ROS,    breath sounds clear to auscultation       Cardiovascular negative cardio ROS   Rhythm:regular Rate:Normal     Neuro/Psych negative neurological ROS  negative psych ROS   GI/Hepatic Neg liver ROS, GERD  ,  Endo/Other  negative endocrine ROS  Renal/GU negative Renal ROS     Musculoskeletal   Abdominal (+) + obese,   Peds  Hematology   Anesthesia Other Findings   Reproductive/Obstetrics negative OB ROS                             Anesthesia Physical  Anesthesia Plan  ASA: II  Anesthesia Plan: General LMA   Post-op Pain Management:    Induction: Intravenous  PONV Risk Score and Plan: 3 and Ondansetron, Dexamethasone and Midazolam  Airway Management Planned: LMA  Additional Equipment:   Intra-op Plan:   Post-operative Plan: Extubation in OR  Informed Consent: I have reviewed the patients History and Physical, chart, labs and discussed the procedure including the risks, benefits and alternatives for the proposed anesthesia with the patient or authorized representative who has indicated his/her understanding and acceptance.   Dental Advisory Given  Plan Discussed with: Anesthesiologist, CRNA and Surgeon  Anesthesia Plan Comments:         Anesthesia Quick Evaluation

## 2018-03-10 NOTE — Anesthesia Postprocedure Evaluation (Signed)
Anesthesia Post Note  Patient: SHAKURA COWING  Procedure(s) Performed: DILATATION & CURETTAGE/HYSTEROSCOPY WITH MYOSURE (N/A )     Patient location during evaluation: PACU Anesthesia Type: General Level of consciousness: awake and alert Pain management: pain level controlled Vital Signs Assessment: post-procedure vital signs reviewed and stable Respiratory status: spontaneous breathing, nonlabored ventilation and respiratory function stable Cardiovascular status: blood pressure returned to baseline and stable Postop Assessment: no apparent nausea or vomiting Anesthetic complications: no    Last Vitals:  Vitals:   03/10/18 0845 03/10/18 0900  BP: 126/85 116/82  Pulse: (!) 57 (!) 56  Resp: 19 17  Temp:  (!) 36.3 C  SpO2: 97% 95%    Last Pain:  Vitals:   03/10/18 0845  TempSrc:   PainSc: 0-No pain   Pain Goal: Patients Stated Pain Goal: 5 (03/10/18 0617)               Lynda Rainwater

## 2018-03-11 ENCOUNTER — Encounter (HOSPITAL_COMMUNITY): Payer: Self-pay | Admitting: Obstetrics and Gynecology

## 2018-03-11 NOTE — Op Note (Signed)
NAME: Michelle Mcneil, Michelle Mcneil MEDICAL RECORD IP:[id number] ACCOUNT [id number] DATE OF BIRTH:1965-09-30 FACILITY: [facility] LOCATION: [facility] PHYSICIAN:Duff Pozzi A. Sebert Stollings, MD  OPERATIVE REPORT  DATE OF PROCEDURE:  03/10/2018  PREOPERATIVE DIAGNOSIS:  Abnormal uterine bleeding, endometrial mass.  PROCEDURE PERFORMED:  Diagnostic hysteroscopy, hysteroscopic resection of endometrial thickening, dilation and curettage.  POSTOPERATIVE DIAGNOSIS:  Abnormal uterine bleeding, endometrial mass.  ANESTHESIA:  General.  SURGEON:  Servando Salina, MD  ASSISTANT:  None.  DESCRIPTION OF PROCEDURE:  Under adequate general anesthesia, the patient was placed in the dorsal lithotomy position.  She was sterilely prepped and draped in the usual fashion.  The bladder was catheterized for a moderate amount of urine.  Examination under anesthesia revealed an irregular anteverted uterus.  No adnexal masses could be appreciated.  Bivalve speculum was placed in the vagina.  Single tooth tenaculum was placed on the anterior and posterior lip of the cervix.  The cervix was dilated  with a #17 Pratt dilator.  The hysteroscope was introduced into the uterine cavity.  Both tubal ostia were noted to be sclerosed.  Thickening on the posterior wall of the endometrium was noted.  No discrete polyp or lesion was seen.  Using the Lite MyoSure resectoscope, the entire endometrial cavity was resected.  The endocervical canal was inspected.  No lesions noted.  All instruments were then removed from the vagina.  SPECIMEN:  Endometrial curettings with possible polyp were sent to pathology.  ESTIMATED BLOOD LOSS:  Minimal.  COMPLICATIONS:  None.  The patient tolerated the procedure well and was transferred to recovery room in stable condition.  TN/NUANCE  D:03/10/2018 T:03/11/2018 JOB:[id number]

## 2018-05-08 ENCOUNTER — Encounter (INDEPENDENT_AMBULATORY_CARE_PROVIDER_SITE_OTHER): Payer: Self-pay

## 2018-05-14 ENCOUNTER — Encounter (INDEPENDENT_AMBULATORY_CARE_PROVIDER_SITE_OTHER): Payer: Self-pay

## 2018-05-14 ENCOUNTER — Ambulatory Visit (INDEPENDENT_AMBULATORY_CARE_PROVIDER_SITE_OTHER): Payer: Self-pay | Admitting: Family Medicine

## 2018-05-28 ENCOUNTER — Ambulatory Visit (INDEPENDENT_AMBULATORY_CARE_PROVIDER_SITE_OTHER): Payer: Self-pay | Admitting: Family Medicine

## 2018-08-06 ENCOUNTER — Encounter: Payer: Self-pay | Admitting: Nurse Practitioner

## 2018-08-06 ENCOUNTER — Ambulatory Visit: Payer: 59 | Admitting: Nurse Practitioner

## 2018-08-06 ENCOUNTER — Other Ambulatory Visit: Payer: Self-pay

## 2018-08-06 VITALS — BP 130/89 | HR 100 | Wt 193.0 lb

## 2018-08-06 DIAGNOSIS — R12 Heartburn: Secondary | ICD-10-CM | POA: Insufficient documentation

## 2018-08-06 DIAGNOSIS — R51 Headache: Secondary | ICD-10-CM

## 2018-08-06 DIAGNOSIS — R519 Headache, unspecified: Secondary | ICD-10-CM | POA: Insufficient documentation

## 2018-08-06 MED ORDER — OMEPRAZOLE 10 MG PO CPDR: 10.0000 mg | DELAYED_RELEASE_CAPSULE | Freq: Every day | ORAL | 1 refills | Status: DC

## 2018-08-06 NOTE — Progress Notes (Signed)
This visit type was conducted due to national recommendations for restrictions regarding the COVID-19 Pandemic (e.g. social distancing).  This format is felt to be most appropriate for this patient at this time.  All issues noted in this document were discussed and addressed.  No physical exam was performed (except for noted visual exam findings with Video Visits).  Please refer to the patient's chart (MyChart message for video visits and phone note for telephone visits) for the patient's consent to telehealth for [place].   Subjective:     Patient ID: Michelle Mcneil , female    DOB: 1965-06-22 , 53 y.o.   MRN: [id number]  Virtual Visit via Telephone Note   I connected with Rosita Fire on 08/06/18 at  2:30 PM EDT by telephone and verified that I am speaking with the correct person using two identifiers.   I discussed the limitations, risks, security and privacy concerns of performing an evaluation and management service by telephone and the availability of in person appointments. I also discussed with the patient that there may be a patient responsible charge related to this service. The patient expressed understanding and agreed to proceed.  Chief Complaint  Patient presents with  . Headache    History of Present Illness:   She has started a new job that is stressful and not exercising as much.  She does not monitor her salt intake.  When she started her new job eating out more than normal.  She is drinking approximately 24 oz of water a day.  Drinks coffee at least 8 oz.  Still   Denies allergy symptoms.    Reports some chest discomfort.  She feels this is the exact same pain she had before when she taken omeprazole. She has been seen by GI.    She has had a tetanus in the last week  Headache   This is a new problem. The current episode started in the past 7 days. The problem has been gradually worsening. Pain location: started on left side of temple then moved to right now all  over frontal area. The pain does not radiate. The pain quality is not similar to prior headaches. The quality of the pain is described as throbbing. The pain is at a severity of 6/10. The pain is mild. Pertinent negatives include no abdominal pain, coughing, dizziness or sore throat. The symptoms are aggravated by work. She has tried NSAIDs for the symptoms. The treatment provided no relief. There is no history of migraine headaches.     Past Medical History:  Diagnosis Date  . Adenomatous colon polyp   . Anemia   . Atypical chest pain 01/06/2016   saw cardiologist - EKG normal-brady, ETT Normal  . Bradycardia 01/06/2016  . GERD (gastroesophageal reflux disease)    no meds, diet controlled  . Pneumonia    as a child  . SVD (spontaneous vaginal delivery)    x 2     Family History  Problem Relation Age of Onset  . Colon polyps Mother   . Alzheimer's disease Mother   . Heart attack Mother   . Colon cancer Maternal Aunt   . Cancer Maternal Aunt        colon  . Diabetes Father   . Hypertension Father   . Cancer Sister   . Breast cancer Sister   . Cancer Son        osteosarcoma     Current Outpatient Medications:  .  acetaminophen (TYLENOL) 500  MG tablet, Take 500 mg by mouth every 6 (six) hours as needed (for pain.)., Disp: , Rfl:  .  BLACK CURRANT SEED OIL PO, Take 1,000 mg by mouth daily., Disp: , Rfl:  .  Cholecalciferol (VITAMIN D-3) 5000 units TABS, Take 5,000 Units by mouth daily., Disp: , Rfl:  .  ibuprofen (ADVIL,MOTRIN) 200 MG tablet, Take 400 mg by mouth every 8 (eight) hours as needed (for pain.)., Disp: , Rfl:  .  Methylsulfonylmethane (MSM PO), Take 1 tablet by mouth daily., Disp: , Rfl:  .  Multiple Vitamins-Minerals (MULTIVITAMIN WITH MINERALS) tablet, Take 1 tablet by mouth every 14 (fourteen) days. Once every 2 weeks., Disp: , Rfl:    No Known Allergies   Review of Systems  Constitutional: Negative for fatigue.  HENT: Negative.  Negative for sore throat.    Eyes: Negative.   Respiratory: Negative.  Negative for cough.   Cardiovascular: Negative.   Gastrointestinal: Negative for abdominal pain.  Neurological: Positive for headaches. Negative for dizziness.     Today's Vitals   08/06/18 1442  BP: 130/89  Pulse: 100  Weight: 193 lb (87.5 kg)    Observations/Objective: Virtual video visit - no acute distress No obvious abnormalities of the head or eyes Negative for JVD or edema       Assessment and Plan: 1. Acute nonintractable headache, unspecified headache type  Encouraged to try Excedrin migraine as needed  Limit stress levels  Increase exercise and increase water intake  2. Heartburn  She has been having episodes after eating different foods will try omeprazole.   I have advised if the pain persist she will need an in person office visit for a possible EKG   Follow Up Instructions: - Return call to office if not better  I discussed the assessment and treatment plan with the patient. The patient was provided an opportunity to ask questions and all were answered. The patient agreed with the plan and demonstrated an understanding of the instructions.   The patient was advised to call back or seek an in-person evaluation if the symptoms worsen or if the condition fails to improve as anticipated.  COVID-19 Education: The signs and symptoms of COVID-19 were discussed with the patient and how to seek care for testing (follow up with PCP or arrange E-visit).  The importance of social distancing was discussed today.   Patient Risk:   After full review of this patients clinical status, I feel that they are at least moderate risk at this time.   I provided 12 minutes of non-face-to-face time during this encounter.   Minette Brine, FNP

## 2018-10-09 ENCOUNTER — Other Ambulatory Visit: Payer: Self-pay | Admitting: Nurse Practitioner

## 2018-10-17 ENCOUNTER — Other Ambulatory Visit: Payer: Self-pay | Admitting: Obstetrics and Gynecology

## 2018-10-17 DIAGNOSIS — Z1231 Encounter for screening mammogram for malignant neoplasm of breast: Secondary | ICD-10-CM

## 2018-10-20 ENCOUNTER — Encounter: Payer: Self-pay | Admitting: Internal Medicine

## 2018-10-20 ENCOUNTER — Ambulatory Visit: Payer: 59 | Admitting: Internal Medicine

## 2018-10-20 ENCOUNTER — Other Ambulatory Visit: Payer: Self-pay

## 2018-10-20 VITALS — BP 116/82 | HR 74 | Temp 97.8°F | Ht 61.4 in | Wt 200.0 lb

## 2018-10-20 DIAGNOSIS — Z0000 Encounter for general adult medical examination without abnormal findings: Secondary | ICD-10-CM

## 2018-10-20 DIAGNOSIS — E6609 Other obesity due to excess calories: Secondary | ICD-10-CM | POA: Diagnosis not present

## 2018-10-20 DIAGNOSIS — Z6837 Body mass index (BMI) 37.0-37.9, adult: Secondary | ICD-10-CM

## 2018-10-20 DIAGNOSIS — E66812 Obesity, class 2: Secondary | ICD-10-CM

## 2018-10-20 LAB — POCT URINALYSIS DIPSTICK
Bilirubin, UA: NEGATIVE
Blood, UA: NEGATIVE
Glucose, UA: NEGATIVE
Ketones, UA: NEGATIVE
Leukocytes, UA: NEGATIVE
Nitrite, UA: NEGATIVE
Protein, UA: NEGATIVE
Spec Grav, UA: 1.01 (ref 1.010–1.025)
Urobilinogen, UA: 0.2 E.U./dL
pH, UA: 6.5 (ref 5.0–8.0)

## 2018-10-20 NOTE — Patient Instructions (Signed)

## 2018-10-20 NOTE — Progress Notes (Signed)
Subjective:     Patient ID: Michelle Mcneil , female    DOB: [birth year] , 53 y.o.   MRN: [id number]   Chief Complaint  Patient presents with  . Annual Exam    HPI  She is here today for a full physical examination. She is followed by Gyn for her pelvic exams. She reports her last exam with Dr. Garwin Brothers was Sept 2019.     Past Medical History:  Diagnosis Date  . Adenomatous colon polyp   . Anemia   . Atypical chest pain 01/06/2016   saw cardiologist - EKG normal-brady, ETT Normal  . Bradycardia 01/06/2016  . GERD (gastroesophageal reflux disease)    no meds, diet controlled  . Pneumonia    as a child  . SVD (spontaneous vaginal delivery)    x 2     Family History  Problem Relation Age of Onset  . Colon polyps Mother   . Alzheimer's disease Mother   . Heart attack Mother   . Colon cancer Maternal Aunt   . Cancer Maternal Aunt        colon  . Diabetes Father   . Hypertension Father   . Cancer Sister   . Breast cancer Sister   . Cancer Son        osteosarcoma     Current Outpatient Medications:  .  acetaminophen (TYLENOL) 500 MG tablet, Take 500 mg by mouth every 6 (six) hours as needed (for pain.)., Disp: , Rfl:  .  BLACK CURRANT SEED OIL PO, Take 1,000 mg by mouth daily., Disp: , Rfl:  .  Cholecalciferol (VITAMIN D) 50 MCG (2000 UT) CAPS, Take by mouth., Disp: , Rfl:  .  ibuprofen (ADVIL,MOTRIN) 200 MG tablet, Take 400 mg by mouth every 8 (eight) hours as needed (for pain.)., Disp: , Rfl:  .  Methylsulfonylmethane (MSM PO), Take 1 tablet by mouth daily., Disp: , Rfl:  .  Multiple Vitamins-Minerals (MULTIVITAMIN WITH MINERALS) tablet, Take 1 tablet by mouth every 14 (fourteen) days. Once every 2 weeks., Disp: , Rfl:  .  omeprazole (PRILOSEC) 10 MG capsule, TAKE 1 CAPSULE(10 MG) BY MOUTH DAILY, Disp: 30 capsule, Rfl: 1   No Known Allergies    The patient states she uses none for birth control. Last LMP was Patient's last menstrual period was 09/29/2018 (exact  date).. Negative for Dysmenorrhea Negative for: breast discharge, breast lump(s), breast pain and breast self exam. Associated symptoms include abnormal vaginal bleeding. Pertinent negatives include abnormal bleeding (hematology), anxiety, decreased libido, depression, difficulty falling sleep, dyspareunia, history of infertility, nocturia, sexual dysfunction, sleep disturbances, urinary incontinence, urinary urgency, vaginal discharge and vaginal itching. Diet regular.The patient states her exercise level is   moderate.   . The patient's tobacco use is:  Social History   Tobacco Use  Smoking Status Never Smoker  Smokeless Tobacco Never Used  . She has been exposed to passive smoke. The patient's alcohol use is:  Social History   Substance and Sexual Activity  Alcohol Use No  . Additional information: Last pap 12/2017, next one scheduled for Fall 2020.   Review of Systems  Constitutional: Negative.   HENT: Negative.   Eyes: Negative.   Respiratory: Negative.   Cardiovascular: Negative.   Gastrointestinal: Negative.   Endocrine: Negative.   Genitourinary: Negative.   Musculoskeletal: Negative.   Skin: Negative.   Allergic/Immunologic: Negative.   Neurological: Negative.   Hematological: Negative.   Psychiatric/Behavioral: Negative.      Today's Vitals  10/20/18 0842  BP: 116/82  Pulse: 74  Temp: 97.8 F (36.6 C)  TempSrc: Other (Comment)  Weight: 200 lb (90.7 kg)  Height: 5' 1.4" (1.56 m)   Body mass index is 37.3 kg/m.   Objective:  Physical Exam Vitals signs and nursing note reviewed.  Constitutional:      Appearance: Normal appearance.  HENT:     Head: Normocephalic and atraumatic.     Right Ear: Tympanic membrane, ear canal and external ear normal.     Left Ear: Tympanic membrane, ear canal and external ear normal.     Nose: Nose normal.     Mouth/Throat:     Mouth: Mucous membranes are moist.     Pharynx: Oropharynx is clear.  Eyes:     Extraocular  Movements: Extraocular movements intact.     Conjunctiva/sclera: Conjunctivae normal.     Pupils: Pupils are equal, round, and reactive to light.  Neck:     Musculoskeletal: Normal range of motion and neck supple.  Cardiovascular:     Rate and Rhythm: Normal rate and regular rhythm.     Pulses: Normal pulses.     Heart sounds: Normal heart sounds.  Pulmonary:     Effort: Pulmonary effort is normal.     Breath sounds: Normal breath sounds.  Chest:     Breasts: Tanner Score is 5.        Right: Normal. No swelling, bleeding, inverted nipple, mass, nipple discharge or skin change.        Left: Normal. No swelling, bleeding, inverted nipple, mass, nipple discharge or skin change.  Abdominal:     General: Abdomen is flat. Bowel sounds are normal.     Palpations: Abdomen is soft.  Genitourinary:    Comments: deferred Musculoskeletal: Normal range of motion.  Skin:    General: Skin is warm and dry.  Neurological:     General: No focal deficit present.     Mental Status: She is alert and oriented to person, place, and time.  Psychiatric:        Mood and Affect: Mood normal.        Behavior: Behavior normal.         Assessment And Plan:     1. Routine general medical examination at health care facility  A full exam was performed. Importance of monthly self breast exams was discussed with the patient. PATIENT HAS BEEN ADVISED TO GET 30-45 MINUTES REGULAR EXERCISE NO LESS THAN FOUR TO FIVE DAYS PER WEEK - BOTH WEIGHTBEARING EXERCISES AND AEROBIC ARE RECOMMENDED.  SHE IS ADVISED TO FOLLOW A HEALTHY DIET WITH AT LEAST SIX FRUITS/VEGGIES PER DAY, DECREASE INTAKE OF RED MEAT, AND TO INCREASE FISH INTAKE TO TWO DAYS PER WEEK.  MEATS/FISH SHOULD NOT BE FRIED, BAKED OR BROILED IS PREFERABLE.  I SUGGEST WEARING SPF 50 SUNSCREEN ON EXPOSED PARTS AND ESPECIALLY WHEN IN THE DIRECT SUNLIGHT FOR AN EXTENDED PERIOD OF TIME.  PLEASE AVOID FAST FOOD RESTAURANTS AND INCREASE YOUR WATER INTAKE.  -  CMP14+EGFR - CBC - Lipid panel - Hemoglobin A1c - QuantiFERON-TB Gold Plus - Measles/Mumps/Rubella Immunity - Varicella zoster antibody, IgG - Hepatitis B Surface Antibody - POCT Urinalysis Dipstick (81002)   2. Class 2 obesity due to excess calories without serious comorbidity with body mass index (BMI) of 37.0 to 37.9 in adult  WE DISCUSSED THE USE OF WEIGHT LOSS MEDS TO HELP WITH OBESITY. SHE DENIES FAMILY HISTORY OF THYROID CANCER. SHE WAS INSTRUCTED ON HOW TO SELF ADMINISTER  THE MEDICATION. SHE WILL START WITH 0.6MG DAILY AND INCREASE HER DOSE BY 5 CLICKS ONCE WEEKLY. SHE WAS REMINDED OF POSSIBLE SIDE EFFECTS INCLUDING NAUSEA, HEADACHES AND DIZZINESS.  SHE WAS ADVISED TO STOP EATING WHEN SHE BEGINS TO FEEL FULL.  SHE WILL RTO IN 8 WEEKS FOR RE-EVALUATION.   Maximino Greenland, MD    THE PATIENT IS ENCOURAGED TO PRACTICE SOCIAL DISTANCING DUE TO THE COVID-19 PANDEMIC.

## 2018-10-22 LAB — CMP14+EGFR
ALT: 8 IU/L (ref 0–32)
AST: 19 IU/L (ref 0–40)
Albumin/Globulin Ratio: 1.3 (ref 1.2–2.2)
Albumin: 4 g/dL (ref 3.8–4.9)
Alkaline Phosphatase: 106 IU/L (ref 39–117)
BUN/Creatinine Ratio: 12 (ref 9–23)
BUN: 11 mg/dL (ref 6–24)
Bilirubin Total: 0.3 mg/dL (ref 0.0–1.2)
CO2: 23 mmol/L (ref 20–29)
Calcium: 9.3 mg/dL (ref 8.7–10.2)
Chloride: 98 mmol/L (ref 96–106)
Creatinine, Ser: 0.9 mg/dL (ref 0.57–1.00)
GFR calc Af Amer: 84 mL/min/{1.73_m2} (ref >59)
GFR calc non Af Amer: 73 mL/min/{1.73_m2} (ref >59)
Globulin, Total: 3 g/dL (ref 1.5–4.5)
Glucose: 90 mg/dL (ref 65–99)
Potassium: 4.3 mmol/L (ref 3.5–5.2)
Sodium: 134 mmol/L (ref 134–144)
Total Protein: 7 g/dL (ref 6.0–8.5)

## 2018-10-22 LAB — HEPATITIS B SURFACE ANTIBODY,QUALITATIVE: Hep B Surface Ab, Qual: NONREACTIVE

## 2018-10-22 LAB — QUANTIFERON-TB GOLD PLUS
QuantiFERON Mitogen Value: >10 IU/mL
QuantiFERON Nil Value: 0.02 IU/mL
QuantiFERON TB1 Ag Value: 0.02 IU/mL
QuantiFERON TB2 Ag Value: 0.02 IU/mL
QuantiFERON-TB Gold Plus: NEGATIVE

## 2018-10-22 LAB — CBC
Hematocrit: 38.9 % (ref 34.0–46.6)
Hemoglobin: 12.5 g/dL (ref 11.1–15.9)
MCH: 31.6 pg (ref 26.6–33.0)
MCHC: 32.1 g/dL (ref 31.5–35.7)
MCV: 98 fL — ABNORMAL HIGH (ref 79–97)
Platelets: 273 10*3/uL (ref 150–450)
RBC: 3.96 x10E6/uL (ref 3.77–5.28)
RDW: 13.1 % (ref 11.7–15.4)
WBC: 8.2 10*3/uL (ref 3.4–10.8)

## 2018-10-22 LAB — LIPID PANEL
Chol/HDL Ratio: 2.3 ratio (ref 0.0–4.4)
Cholesterol, Total: 180 mg/dL (ref 100–199)
HDL: 78 mg/dL (ref >39)
LDL Calculated: 92 mg/dL (ref 0–99)
Triglycerides: 50 mg/dL (ref 0–149)
VLDL Cholesterol Cal: 10 mg/dL (ref 5–40)

## 2018-10-22 LAB — MEASLES/MUMPS/RUBELLA IMMUNITY
MUMPS ABS, IGG: 41.7 AU/mL (ref >10.9)
RUBEOLA AB, IGG: 14.2 AU/mL — ABNORMAL LOW (ref >16.4)
Rubella Antibodies, IGG: 3.44 index (ref >0.99)

## 2018-10-22 LAB — HEMOGLOBIN A1C
Est. average glucose Bld gHb Est-mCnc: 111 mg/dL
Hgb A1c MFr Bld: 5.5 % (ref 4.8–5.6)

## 2018-10-22 LAB — VARICELLA ZOSTER ANTIBODY, IGG: Varicella zoster IgG: 2986 index (ref >165)

## 2018-10-29 ENCOUNTER — Other Ambulatory Visit: Payer: Self-pay

## 2018-10-29 ENCOUNTER — Telehealth: Payer: Self-pay

## 2018-10-29 ENCOUNTER — Other Ambulatory Visit: Payer: Self-pay | Admitting: Internal Medicine

## 2018-10-29 DIAGNOSIS — R760 Raised antibody titer: Secondary | ICD-10-CM

## 2018-10-29 MED ORDER — SAXENDA 18 MG/3ML ~~LOC~~ SOPN: 3.0000 mg | PEN_INJECTOR | Freq: Every day | SUBCUTANEOUS | 1 refills | Status: DC

## 2018-10-29 MED ORDER — PEN NEEDLES 32G X 4 MM MISC: 1.0000 | Freq: Every day | 2 refills | Status: DC

## 2018-10-29 NOTE — Telephone Encounter (Signed)
The pt was notified that her physical form will be ready at 3 pm today.

## 2018-10-30 ENCOUNTER — Encounter: Payer: Self-pay | Admitting: Internal Medicine

## 2018-10-30 LAB — RUBEOLA ANTIBODY IGG: RUBEOLA AB, IGG: 15.1 AU/mL — ABNORMAL LOW (ref >16.4)

## 2018-11-05 ENCOUNTER — Telehealth: Payer: Self-pay

## 2018-11-05 NOTE — Telephone Encounter (Signed)
Prior auth sent to plan for saxenda.

## 2018-11-06 ENCOUNTER — Telehealth: Payer: Self-pay

## 2018-11-06 NOTE — Telephone Encounter (Signed)
Pt notified of denial for saxenda by insurance. Pt will call back to let me know if she wants to try victoza. Pt stated that she is going to call the insurance company to see what medications they will cover for weight loss

## 2018-11-07 ENCOUNTER — Telehealth: Payer: Self-pay

## 2018-11-07 NOTE — Telephone Encounter (Signed)
Left the patient a message to call back for lab results. 

## 2018-11-07 NOTE — Telephone Encounter (Signed)
-----  Message from Glendale Chard, MD sent at 10/31/2018  8:17 AM EDT ----- Rubeola antibody is still low. She will need MMR vaccine. Does she want [place] to refer her to health dept or [facility] to receive her vaccines?

## 2018-11-18 ENCOUNTER — Telehealth: Payer: Self-pay

## 2018-11-18 NOTE — Telephone Encounter (Signed)
-----  Message from Glendale Chard, MD sent at 11/17/2018  3:09 PM EDT ----- Hello,   She should not need prescription. She should take lab results to Occupational health and they will administer immunizations. This  is how it has been handled in the past.   RS ----- Message ----- From: Michelle Nasuti, [place] Sent: 11/17/2018   2:25 PM EDT To: Glendale Chard, MD  The pt wants to know if she needed a prescription to get an MMR vaccination from the [facility] and if she needed a varicella. I was unsure if she needed it based off of the results.

## 2018-11-18 NOTE — Telephone Encounter (Signed)
PT NOTIFIED  

## 2018-11-19 ENCOUNTER — Telehealth: Payer: Self-pay

## 2018-11-19 ENCOUNTER — Other Ambulatory Visit: Payer: Self-pay

## 2018-11-19 NOTE — Telephone Encounter (Signed)
Unable to leave message. Need clarification of why she wants victoza. Did she talk with [facility]? [facility] wants to know what her insurance covers

## 2018-11-19 NOTE — Telephone Encounter (Signed)
Pt informed to call her insurance again to find out what they cover for weight loss

## 2018-11-28 ENCOUNTER — Ambulatory Visit
Admission: RE | Admit: 2018-11-28 | Discharge: 2018-11-28 | Disposition: A | Discharge status: Home / Self Care | Payer: 59 | Source: Ambulatory Visit | Attending: Obstetrics and Gynecology | Admitting: Obstetrics and Gynecology

## 2018-11-28 ENCOUNTER — Other Ambulatory Visit: Payer: Self-pay

## 2018-11-28 DIAGNOSIS — Z1231 Encounter for screening mammogram for malignant neoplasm of breast: Secondary | ICD-10-CM

## 2018-12-04 ENCOUNTER — Ambulatory Visit: Payer: 59 | Admitting: Internal Medicine

## 2019-10-26 ENCOUNTER — Encounter: Payer: Self-pay | Admitting: Internal Medicine

## 2019-10-26 ENCOUNTER — Other Ambulatory Visit: Payer: Self-pay

## 2019-10-26 ENCOUNTER — Ambulatory Visit (INDEPENDENT_AMBULATORY_CARE_PROVIDER_SITE_OTHER): Payer: 59 | Admitting: Internal Medicine

## 2019-10-26 VITALS — BP 114/70 | HR 88 | Temp 97.8°F | Ht 62.2 in | Wt 198.4 lb

## 2019-10-26 DIAGNOSIS — Z0000 Encounter for general adult medical examination without abnormal findings: Secondary | ICD-10-CM | POA: Diagnosis not present

## 2019-10-26 DIAGNOSIS — Z1159 Encounter for screening for other viral diseases: Secondary | ICD-10-CM | POA: Diagnosis not present

## 2019-10-26 DIAGNOSIS — G43829 Menstrual migraine, not intractable, without status migrainosus: Secondary | ICD-10-CM | POA: Diagnosis not present

## 2019-10-26 NOTE — Patient Instructions (Signed)
Health Maintenance, Female Adopting a healthy lifestyle and getting preventive care are important in promoting health and wellness. Ask your health care provider about:  The right schedule for you to have regular tests and exams.  Things you can do on your own to prevent diseases and keep yourself healthy. What should I know about diet, weight, and exercise? Eat a healthy diet   Eat a diet that includes plenty of vegetables, fruits, low-fat dairy products, and lean protein.  Do not eat a lot of foods that are high in solid fats, added sugars, or sodium. Maintain a healthy weight Body mass index (BMI) is used to identify weight problems. It estimates body fat based on height and weight. Your health care provider can help determine your BMI and help you achieve or maintain a healthy weight. Get regular exercise Get regular exercise. This is one of the most important things you can do for your health. Most adults should:  Exercise for at least 150 minutes each week. The exercise should increase your heart rate and make you sweat (moderate-intensity exercise).  Do strengthening exercises at least twice a week. This is in addition to the moderate-intensity exercise.  Spend less time sitting. Even light physical activity can be beneficial. Watch cholesterol and blood lipids Have your blood tested for lipids and cholesterol at 54 years of age, then have this test every 5 years. Have your cholesterol levels checked more often if:  Your lipid or cholesterol levels are high.  You are older than 54 years of age.  You are at high risk for heart disease. What should I know about cancer screening? Depending on your health history and family history, you may need to have cancer screening at various ages. This may include screening for:  Breast cancer.  Cervical cancer.  Colorectal cancer.  Skin cancer.  Lung cancer. What should I know about heart disease, diabetes, and high blood  pressure? Blood pressure and heart disease  High blood pressure causes heart disease and increases the risk of stroke. This is more likely to develop in people who have high blood pressure readings, are of African descent, or are overweight.  Have your blood pressure checked: ? Every 3-5 years if you are 18-39 years of age. ? Every year if you are 40 years old or older. Diabetes Have regular diabetes screenings. This checks your fasting blood sugar level. Have the screening done:  Once every three years after age 40 if you are at a normal weight and have a low risk for diabetes.  More often and at a younger age if you are overweight or have a high risk for diabetes. What should I know about preventing infection? Hepatitis B If you have a higher risk for hepatitis B, you should be screened for this virus. Talk with your health care provider to find out if you are at risk for hepatitis B infection. Hepatitis C Testing is recommended for:  Everyone born from 1945 through 1965.  Anyone with known risk factors for hepatitis C. Sexually transmitted infections (STIs)  Get screened for STIs, including gonorrhea and chlamydia, if: ? You are sexually active and are younger than 54 years of age. ? You are older than 54 years of age and your health care provider tells you that you are at risk for this type of infection. ? Your sexual activity has changed since you were last screened, and you are at increased risk for chlamydia or gonorrhea. Ask your health care provider if   you are at risk.  Ask your health care provider about whether you are at high risk for HIV. Your health care provider may recommend a prescription medicine to help prevent HIV infection. If you choose to take medicine to prevent HIV, you should first get tested for HIV. You should then be tested every 3 months for as long as you are taking the medicine. Pregnancy  If you are about to stop having your period (premenopausal) and  you may become pregnant, seek counseling before you get pregnant.  Take 400 to 800 micrograms (mcg) of folic acid every day if you become pregnant.  Ask for birth control (contraception) if you want to prevent pregnancy. Osteoporosis and menopause Osteoporosis is a disease in which the bones lose minerals and strength with aging. This can result in bone fractures. If you are 17 years old or older, or if you are at risk for osteoporosis and fractures, ask your health care provider if you should:  Be screened for bone loss.  Take a calcium or vitamin D supplement to lower your risk of fractures.  Be given hormone replacement therapy (HRT) to treat symptoms of menopause. Follow these instructions at home: Lifestyle  Do not use any products that contain nicotine or tobacco, such as cigarettes, e-cigarettes, and chewing tobacco. If you need help quitting, ask your health care provider.  Do not use street drugs.  Do not share needles.  Ask your health care provider for help if you need support or information about quitting drugs. Alcohol use  Do not drink alcohol if: ? Your health care provider tells you not to drink. ? You are pregnant, may be pregnant, or are planning to become pregnant.  If you drink alcohol: ? Limit how much you use to 0-1 drink a day. ? Limit intake if you are breastfeeding.  Be aware of how much alcohol is in your drink. In the [place] one drink equals one 12 oz bottle of beer (355 mL), one 5 oz glass of wine (148 mL), or one 1 oz glass of hard liquor (44 mL). General instructions  Schedule regular health, dental, and eye exams.  Stay current with your vaccines.  Tell your health care provider if: ? You often feel depressed. ? You have ever been abused or do not feel safe at home. Summary  Adopting a healthy lifestyle and getting preventive care are important in promoting health and wellness.  Follow your health care provider's instructions about healthy  diet, exercising, and getting tested or screened for diseases.  Follow your health care provider's instructions on monitoring your cholesterol and blood pressure. This information is not intended to replace advice given to you by your health care provider. Make sure you discuss any questions you have with your health care provider. Document Revised: 04/09/2018 Document Reviewed: 04/09/2018 Elsevier Patient Education  [place] oral dissolving tablet What is this medicine? RIMEGEPANT (ri ME je pant) is used to treat migraine headaches with or without aura. An aura is a strange feeling or visual disturbance that warns you of an attack. It is not used to prevent migraines. This medicine may be used for other purposes; ask your health care provider or pharmacist if you have questions. COMMON BRAND NAME(S): NURTEC ODT What should I tell my health care provider before I take this medicine? They need to know if you have any of these conditions:  kidney disease  liver disease  an unusual or allergic reaction to rimegepant, other  medicines, foods, dyes, or preservatives  pregnant or trying to get pregnant  breast-feeding How should I use this medicine? Take the medicine by mouth. Follow the directions on the prescription label. Leave the tablet in the sealed blister pack until you are ready to take it. With dry hands, open the blister and gently remove the tablet. If the tablet breaks or crumbles, throw it away and take a new tablet out of the blister pack. Place the tablet in the mouth and allow it to dissolve, and then swallow. Do not cut, crush, or chew this medicine. You do not need water to take this medicine. Talk to your pediatrician about the use of this medicine in children. Special care may be needed. Overdosage: If you think you have taken too much of this medicine contact a poison control center or emergency room at once. NOTE: This medicine is only for you. Do  not share this medicine with others. What if I miss a dose? This does not apply. This medicine is not for regular use. What may interact with this medicine? This medicine may interact with the following medications:  certain medicines for fungal infections like fluconazole, itraconazole  rifampin This list may not describe all possible interactions. Give your health care provider a list of all the medicines, herbs, non-prescription drugs, or dietary supplements you use. Also tell them if you smoke, drink alcohol, or use illegal drugs. Some items may interact with your medicine. What should I watch for while using this medicine? Visit your health care professional for regular checks on your progress. Tell your health care professional if your symptoms do not start to get better or if they get worse. What side effects may I notice from receiving this medicine? Side effects that you should report to your doctor or health care professional as soon as possible:  allergic reactions like skin rash, itching or hives; swelling of the face, lips, or tongue Side effects that usually do not require medical attention (report these to your doctor or health care professional if they continue or are bothersome):  nausea This list may not describe all possible side effects. Call your doctor for medical advice about side effects. You may report side effects to FDA at 1-800-FDA-1088. Where should I keep my medicine? Keep out of the reach of children. Store at room temperature between 15 and 30 degrees C (59 and 86 degrees F). Throw away any unused medicine after the expiration date. NOTE: This sheet is a summary. It may not cover all possible information. If you have questions about this medicine, talk to your doctor, pharmacist, or health care provider.  2020 Elsevier/Gold Standard (2018-06-30 00:21:31)

## 2019-10-26 NOTE — Progress Notes (Signed)
This visit occurred during the SARS-CoV-2 public health emergency.  Safety protocols were in place, including screening questions prior to the visit, additional usage of staff PPE, and extensive cleaning of exam room while observing appropriate contact time as indicated for disinfecting solutions.  Subjective:     Patient ID: Michelle Mcneil , female    DOB: 07-Jan-1966 , 54 y.o.   MRN: [id number]   Chief Complaint  Patient presents with  . Annual Exam    HPI  She is here today for a full physical examination. She is followed by Dr. Garwin Brothers for her GYN exams.  She has no specific concerns or complaints at this time.     Past Medical History:  Diagnosis Date  . Adenomatous colon polyp   . Anemia   . Atypical chest pain 01/06/2016   saw cardiologist - EKG normal-brady, ETT Normal  . Bradycardia 01/06/2016  . GERD (gastroesophageal reflux disease)    no meds, diet controlled  . Pneumonia    as a child  . SVD (spontaneous vaginal delivery)    x 2     Family History  Problem Relation Age of Onset  . Colon polyps Mother   . Alzheimer's disease Mother   . Heart attack Mother   . Colon cancer Maternal Aunt   . Cancer Maternal Aunt        colon  . Diabetes Father   . Hypertension Father   . Dementia Father   . Breast cancer Sister   . Cancer Son        osteosarcoma     Current Outpatient Medications:  .  BLACK CURRANT SEED OIL PO, Take 1,000 mg by mouth daily., Disp: , Rfl:  .  Cholecalciferol (VITAMIN D) 50 MCG (2000 UT) CAPS, Take by mouth., Disp: , Rfl:  .  acetaminophen (TYLENOL) 500 MG tablet, Take 500 mg by mouth every 6 (six) hours as needed (for pain.). (Patient not taking: Reported on 10/26/2019), Disp: , Rfl:  .  ibuprofen (ADVIL,MOTRIN) 200 MG tablet, Take 400 mg by mouth every 8 (eight) hours as needed (for pain.). (Patient not taking: Reported on 10/26/2019), Disp: , Rfl:  .  omeprazole (PRILOSEC) 10 MG capsule, TAKE 1 CAPSULE(10 MG) BY MOUTH DAILY (Patient not  taking: Reported on 10/26/2019), Disp: 30 capsule, Rfl: 1   No Known Allergies    The patient states she uses none for birth control. Last LMP was Patient's last menstrual period was 10/17/2019.. Negative for Dysmenorrhea  Negative for: breast discharge, breast lump(s), breast pain and breast self exam. Associated symptoms include abnormal vaginal bleeding. Pertinent negatives include abnormal bleeding (hematology), anxiety, decreased libido, depression, difficulty falling sleep, dyspareunia, history of infertility, nocturia, sexual dysfunction, sleep disturbances, urinary incontinence, urinary urgency, vaginal discharge and vaginal itching. Diet regular.The patient states her exercise level is    . The patient's tobacco use is:  Social History   Tobacco Use  Smoking Status Never Smoker  Smokeless Tobacco Never Used  . She has been exposed to passive smoke. The patient's alcohol use is:  Social History   Substance and Sexual Activity  Alcohol Use No  . Additional information: Last pap one week ago with Dr. Garwin Brothers.   Review of Systems  Constitutional: Negative.   HENT: Negative.   Eyes: Negative.   Respiratory: Negative.   Cardiovascular: Negative.   Endocrine: Negative.   Genitourinary: Negative.   Musculoskeletal: Negative.   Skin: Negative.   Allergic/Immunologic: Negative.   Neurological: Positive for headaches.  She c/o persistent headaches. Started last year during pandemic. Usually left-sided over her eye. Throbbing. She has found that it is associated with her menses. She has tried many OTC meds, only medication that seems to be effective is Extra Strength Excedrin.   Hematological: Negative.   Psychiatric/Behavioral: Negative.      Today's Vitals   10/26/19 0858  BP: 114/70  Pulse: 88  Temp: 97.8 F (36.6 C)  TempSrc: Oral  Weight: 198 lb 6.4 oz (90 kg)  Height: 5' 2.2" (1.58 m)   Body mass index is 36.05 kg/m.   Objective:  Physical Exam Vitals and  nursing note reviewed.  Constitutional:      Appearance: Normal appearance.  HENT:     Head: Normocephalic and atraumatic.     Right Ear: Tympanic membrane, ear canal and external ear normal.     Left Ear: Tympanic membrane, ear canal and external ear normal.     Nose: Nose normal.     Mouth/Throat:     Mouth: Mucous membranes are moist.     Pharynx: Oropharynx is clear.  Eyes:     Extraocular Movements: Extraocular movements intact.     Conjunctiva/sclera: Conjunctivae normal.     Pupils: Pupils are equal, round, and reactive to light.  Cardiovascular:     Rate and Rhythm: Normal rate and regular rhythm.     Pulses: Normal pulses.     Heart sounds: Normal heart sounds.  Pulmonary:     Effort: Pulmonary effort is normal.     Breath sounds: Normal breath sounds.  Chest:     Breasts: Tanner Score is 5.        Right: Normal.        Left: Normal.     Comments: Healed surgical scar on UOQ right breast Abdominal:     General: Bowel sounds are normal. There is no distension.     Palpations: Abdomen is soft.     Tenderness: There is no abdominal tenderness.  Genitourinary:    Comments: deferred Musculoskeletal:        General: Normal range of motion.     Cervical back: Normal range of motion and neck supple.  Skin:    General: Skin is warm and dry.  Neurological:     General: No focal deficit present.     Mental Status: She is alert and oriented to person, place, and time.  Psychiatric:        Mood and Affect: Mood normal.        Behavior: Behavior normal.         Assessment And Plan:     1. Routine general medical examination at health care facility  A full exam was performed.  Importance of monthly self breast exams was discussed with the patient. PATIENT IS ADVISED TO GET 30-45 MINUTES REGULAR EXERCISE NO LESS THAN FOUR TO FIVE DAYS PER WEEK - BOTH WEIGHTBEARING EXERCISES AND AEROBIC ARE RECOMMENDED.  HE/SHE IS ADVISED TO FOLLOW A HEALTHY DIET WITH AT LEAST SIX  FRUITS/VEGGIES PER DAY, DECREASE INTAKE OF RED MEAT, AND TO INCREASE FISH INTAKE TO TWO DAYS PER WEEK.  MEATS/FISH SHOULD NOT BE FRIED, BAKED OR BROILED IS PREFERABLE.  I SUGGEST WEARING SPF 50 SUNSCREEN ON EXPOSED PARTS AND ESPECIALLY WHEN IN THE DIRECT SUNLIGHT FOR AN EXTENDED PERIOD OF TIME.  PLEASE AVOID FAST FOOD RESTAURANTS AND INCREASE YOUR WATER INTAKE.  - Hepatitis C antibody - CMP14+EGFR - CBC - Lipid panel  2. Menstrual migraine without status migrainosus,  not intractable  She was given samples of Nurtec to take upon onset of headache. It is my hope this will help to alleviate her headaches. If ineffective, will consider use of Ubrelvy. She is encouraged to let me know how this works for her. I will see her back in 3 months for re-evaluation.   3. Encounter for HCV screening test for low risk patient  I will check HCV antibody.    Maximino Greenland, MD    THE PATIENT IS ENCOURAGED TO PRACTICE SOCIAL DISTANCING DUE TO THE COVID-19 PANDEMIC.

## 2019-10-27 LAB — CBC
Hematocrit: 37.3 % (ref 34.0–46.6)
Hemoglobin: 11.9 g/dL (ref 11.1–15.9)
MCH: 32 pg (ref 26.6–33.0)
MCHC: 31.9 g/dL (ref 31.5–35.7)
MCV: 100 fL — ABNORMAL HIGH (ref 79–97)
Platelets: 256 10*3/uL (ref 150–450)
RBC: 3.72 x10E6/uL — ABNORMAL LOW (ref 3.77–5.28)
RDW: 12.9 % (ref 11.7–15.4)
WBC: 8.2 10*3/uL (ref 3.4–10.8)

## 2019-10-27 LAB — LIPID PANEL
Chol/HDL Ratio: 2.6 ratio (ref 0.0–4.4)
Cholesterol, Total: 173 mg/dL (ref 100–199)
HDL: 67 mg/dL (ref >39)
LDL Chol Calc (NIH): 90 mg/dL (ref 0–99)
Triglycerides: 88 mg/dL (ref 0–149)
VLDL Cholesterol Cal: 16 mg/dL (ref 5–40)

## 2019-10-27 LAB — CMP14+EGFR
ALT: 8 IU/L (ref 0–32)
AST: 16 IU/L (ref 0–40)
Albumin/Globulin Ratio: 1.3 (ref 1.2–2.2)
Albumin: 4 g/dL (ref 3.8–4.9)
Alkaline Phosphatase: 108 IU/L (ref 48–121)
BUN/Creatinine Ratio: 14 (ref 9–23)
BUN: 13 mg/dL (ref 6–24)
Bilirubin Total: 0.3 mg/dL (ref 0.0–1.2)
CO2: 22 mmol/L (ref 20–29)
Calcium: 9.1 mg/dL (ref 8.7–10.2)
Chloride: 105 mmol/L (ref 96–106)
Creatinine, Ser: 0.91 mg/dL (ref 0.57–1.00)
GFR calc Af Amer: 83 mL/min/{1.73_m2} (ref >59)
GFR calc non Af Amer: 72 mL/min/{1.73_m2} (ref >59)
Globulin, Total: 3 g/dL (ref 1.5–4.5)
Glucose: 84 mg/dL (ref 65–99)
Potassium: 4.5 mmol/L (ref 3.5–5.2)
Sodium: 141 mmol/L (ref 134–144)
Total Protein: 7 g/dL (ref 6.0–8.5)

## 2019-10-27 LAB — HEPATITIS C ANTIBODY: Hep C Virus Ab: <0.1 s/co ratio (ref 0.0–0.9)

## 2019-11-11 ENCOUNTER — Telehealth: Payer: Self-pay

## 2019-11-11 NOTE — Telephone Encounter (Signed)
I tried to call the pt back, her cell number doesn't have the voicemail setup and I was unable to leave a message on the home number. The pt wanted to know how much vitamin b complex she should have.  Laurance Flatten, NP said that she should have 1,000 mcg daily.

## 2019-11-26 ENCOUNTER — Other Ambulatory Visit: Payer: Self-pay | Admitting: Internal Medicine

## 2019-11-26 ENCOUNTER — Telehealth: Payer: Self-pay

## 2019-11-26 MED ORDER — NURTEC 75 MG PO TBDP: ORAL_TABLET | ORAL | 1 refills | Status: DC

## 2019-11-26 NOTE — Telephone Encounter (Signed)
The patient called and said that the Nurtec samples helped with her migraines and would like a prescription.  The pt was told that I would let her know when the prescription has ben sent.

## 2020-01-21 ENCOUNTER — Ambulatory Visit: Payer: Self-pay | Admitting: Nurse Practitioner

## 2020-01-21 ENCOUNTER — Telehealth: Payer: Self-pay | Admitting: Internal Medicine

## 2020-01-21 NOTE — Telephone Encounter (Signed)
Patient comes in to her appointment today and refused to take her temperature with the thermometer by mouth that we use in our office. She wanted to use her own and I stated to the patient that we use the healthcare grade thermometer the patient be angry and states to me that "we are putting people at risk for transmitting of COVID." I explained to the patient that [place] states that the forehead is not accurate she still refused and left and said to cancelled her appointment and when I asked her name she stormed out and said don't worry about.

## 2020-01-24 NOTE — Progress Notes (Signed)
Virtual Visit via Telephone Note   This visit type was conducted due to national recommendations for restrictions regarding the COVID-19 Pandemic (e.g. social distancing) in an effort to limit this patient's exposure and mitigate transmission in our community.  Due to her co-morbid illnesses, this patient is at least at moderate risk for complications without adequate follow up.  This format is felt to be most appropriate for this patient at this time.  The patient did not have access to video technology/had technical difficulties with video requiring transitioning to audio format only (telephone).  All issues noted in this document were discussed and addressed.  No physical exam could be performed with this format.  Please refer to the patient's chart for her  consent to telehealth for [facility].  Evaluation Performed:  Follow-up visit  This visit type was conducted due to national recommendations for restrictions regarding the COVID-19 Pandemic (e.g. social distancing).  This format is felt to be most appropriate for this patient at this time.  All issues noted in this document were discussed and addressed.  No physical exam was performed (except for noted visual exam findings with Video Visits).  Please refer to the patient's chart (MyChart message for video visits and phone note for telephone visits) for the patient's consent to telehealth for [facility]  Date:  01/25/2020   ID:  Michelle Mcneil, DOB 11/15/1965, MRN [id number]  Patient Location:  [place] [address]   Provider location:     [place] [address] Office [phone] Fax [phone]   PCP:  Glendale Chard, MD  Cardiologist:  Skeet Latch, MD  Electrophysiologist:  None   Chief Complaint: Follow-up for chest pain  History of Present Illness:    Michelle Mcneil is a 54 y.o. female who presents via audio/video conferencing  for a telehealth visit today.  Patient verified DOB and address.  She has a past medical history of pneumonia, GERD, bradycardia, atypical chest pain, anemia, and spontaneous vaginal delivery x2.  She was last seen by Dr. Oval Linsey on 01/06/2016.  During that time she reported episodes of sharp stabbing chest discomfort that occurred daily.  She rated the pain 3-4 out of 10.  She indicated that the pain would last for a few seconds at a time and occur several times per day.  She described the pain as radiating down her left arm.  She had no associated shortness of breath diaphoresis or nausea.  Her episodes occurred at rest and not with exertion.  At the time of her visit she was walking or running 3 miles per day.  She denied shortness of breath and chest discomfort with activity.  She occasionally had lightheadedness/dizziness with changing positions.  She denied syncope.  She indicated that both her heart rate and her blood pressure had always been low.  She also denied lower extremity edema, orthopnea, and PND.  She also indicated that she had similar episodes of chest discomfort in 2008 and was diagnosed with costochondritis.  Ibuprofen was recommended which improved her symptoms.  Finally she stated that her mother had heart attack at age 69 and she wanted to make sure that her symptoms are not related to her heart.  She was also working on improving her diet.  She was eating more fruits and vegetables and was staying away from fried foods.  She is seen virtually today and states over the last several days she  has noticed increasing episodes of chest discomfort.  She states they are very similar to when she was evaluated by cardiology previously.  Her chest pain is sharp, radiates to her left arm, lasts for about 10 minutes.  It is nonexertional.  She states that she has had increased stress at work over the last 2 weeks.  She has decreased her caffeine consumption which she thinks has helped somewhat.  She  also notices increased fatigue over the last 2 weeks and indicates that her diet has not been as strict over the last 2 weeks as well.  Over the last 3 days she has noticed decreasing episodes of the sharp chest pain.  She states that she is also not been as physically active lately well previously she was walking around 3 miles per day.  I will order a exercise tolerance test, echocardiogram, give the salty 6 diet sheet and have her follow-up in 1 month.  Today she denies chest pain, shortness of breath, lower extremity edema, fatigue, palpitations, melena, hematuria, hemoptysis, diaphoresis, weakness, presyncope, syncope, orthopnea, and PND.   The patient does not symptoms concerning for COVID-19 infection (fever, chills, cough, or new SHORTNESS OF BREATH).    Prior CV studies:   The following studies were reviewed today:  Exercise tolerance test 01/11/2016   There was no ST segment deviation noted during stress.  10 minutes and 33 seconds-good exercise time. Normal blood pressure response.  Low risk exercise treadmill test with no evidence of ischemia   Past Medical History:  Diagnosis Date   Adenomatous colon polyp    Anemia    Atypical chest pain 01/06/2016   saw cardiologist - EKG normal-brady, ETT Normal   Bradycardia 01/06/2016   GERD (gastroesophageal reflux disease)    no meds, diet controlled   Pneumonia    as a child   SVD (spontaneous vaginal delivery)    x 2   Past Surgical History:  Procedure Laterality Date   BREAST EXCISIONAL BIOPSY Right 06/03/2013   BREAST LUMPECTOMY WITH NEEDLE LOCALIZATION Right 06/03/2013   Procedure: BREAST LUMPECTOMY WITH NEEDLE LOCALIZATION;  Surgeon: Marcello Moores A. Cornett, MD;  Location: [facility];  Service: General;  Laterality: Right;   BREAST SURGERY Right    lumpectomy   cerclage stitch     COLONOSCOPY  01/21/2012   Procedure: COLONOSCOPY;  Surgeon: Lafayette Dragon, MD;  Location: [facility];  Service: Endoscopy;  Laterality:  N/A;   DILATATION & CURETTAGE/HYSTEROSCOPY WITH MYOSURE N/A 03/10/2018   Procedure: DILATATION & CURETTAGE/HYSTEROSCOPY WITH MYOSURE;  Surgeon: Servando Salina, MD;  Location: [facility];  Service: Gynecology;  Laterality: N/A;   EYE SURGERY Bilateral    Lasik   HAND SURGERY Right    trigger finer - thumb   TUBAL LIGATION     UPPER GI ENDOSCOPY     WISDOM TOOTH EXTRACTION  1993     Current Meds  Medication Sig   acetaminophen (TYLENOL) 500 MG tablet Take 500 mg by mouth every 6 (six) hours as needed (for pain.).    b complex vitamins capsule Take 1 capsule by mouth daily.   Cholecalciferol (VITAMIN D) 50 MCG (2000 UT) CAPS Take by mouth.   ibuprofen (ADVIL,MOTRIN) 200 MG tablet Take 400 mg by mouth every 8 (eight) hours as needed (for pain.).    Rimegepant Sulfate (NURTEC) 75 MG TBDP ONE TAB PO QD PRN MIGRAINES     Allergies:   Patient has no known allergies.   Social History   Tobacco Use  Smoking status: Never Smoker   Smokeless tobacco: Never Used  Vaping Use   Vaping Use: Never used  Substance Use Topics   Alcohol use: No   Drug use: No     Family Hx: The patient's family history includes Alzheimer's disease in her mother; Breast cancer in her sister; Cancer in her maternal aunt and son; Colon cancer in her maternal aunt; Colon polyps in her mother; Dementia in her father; Diabetes in her father; Heart attack in her mother; Hypertension in her father.  ROS:   Please see the history of present illness.     All other systems reviewed and are negative.   Labs/Other Tests and Data Reviewed:    Recent Labs: 10/26/2019: ALT 8; BUN 13; Creatinine, Ser 0.91; Hemoglobin 11.9; Platelets 256; Potassium 4.5; Sodium 141   Recent Lipid Panel Lab Results  Component Value Date/Time   CHOL 173 10/26/2019 10:07 AM   TRIG 88 10/26/2019 10:07 AM   HDL 67 10/26/2019 10:07 AM   CHOLHDL 2.6 10/26/2019 10:07 AM   LDLCALC 90 10/26/2019 10:07 AM    Wt Readings  from Last 3 Encounters:  10/26/19 198 lb 6.4 oz (90 kg)  10/20/18 200 lb (90.7 kg)  08/06/18 193 lb (87.5 kg)     Exam:    Vital Signs:  There were no vitals taken for this visit.   Well nourished, well developed female in no  acute distress.   ASSESSMENT & PLAN:    1.  Atypical chest pain-again has noticed several episodes of chest discomfort that she describes as sharp which radiates to her left arm.  These episodes last as long as 10 minutes and dissipate on their own.  She has had less episodes over the last few days and has decreased her caffeine intake.  These episodes are nonexertional.    Underwent exercise tolerance test 2017 which showed no ischemia and was low risk. Heart healthy low-sodium diet-salty 6 given Increase physical activity as tolerated Repeat exercise tolerance test Order echocardiogram  Fatigue-has noticed increased fatigue over the last 2 weeks.  Has had increased stress with her work, has been less strict with her diet, and is now exercising months.  States she sleeps well. Order echocardiogram Heart healthy low-sodium diet  Sinus bradycardia-unable to obtain.  No episodes of presyncope, syncope, fatigue, or activity intolerance. Continue to monitor  Disposition: Follow-up with Dr. Oval Linsey or me in 1 month.  COVID-19 Education: The signs and symptoms of COVID-19 were discussed with the patient and how to seek care for testing (follow up with PCP or arrange E-visit).  The importance of social distancing was discussed today.  Patient Risk:   After full review of this patients clinical status, I feel that they are at least moderate risk at this time.  Time:   Today, I have spent 10 minutes with the patient with telehealth technology discussing chest pain, lab work, and fatigue.  I spent greater than 20 minutes reviewing her past medical history, previous cardiac test, and previous notes.   Medication Adjustments/Labs and Tests Ordered: Current  medicines are reviewed at length with the patient today.  Concerns regarding medicines are outlined above.   Tests Ordered: No orders of the defined types were placed in this encounter.  Medication Changes: No orders of the defined types were placed in this encounter.   Disposition:  in 1 month(s)  Signed, Jossie Ng. Aerielle Stoklosa NP-C    12/02/2018 11:58 AM    [facility]  [place] [phone] Fax [phone]

## 2020-01-25 ENCOUNTER — Encounter: Payer: Self-pay | Admitting: General Practice

## 2020-01-25 ENCOUNTER — Telehealth (INDEPENDENT_AMBULATORY_CARE_PROVIDER_SITE_OTHER): Payer: 59 | Admitting: General Practice

## 2020-01-25 DIAGNOSIS — R001 Bradycardia, unspecified: Secondary | ICD-10-CM | POA: Diagnosis not present

## 2020-01-25 DIAGNOSIS — R5383 Other fatigue: Secondary | ICD-10-CM | POA: Diagnosis not present

## 2020-01-25 DIAGNOSIS — R0789 Other chest pain: Secondary | ICD-10-CM

## 2020-01-25 NOTE — Patient Instructions (Addendum)
Medication Instructions:  The current medical regimen is effective;  continue present plan and medications as directed. Please refer to the Current Medication list given to you today. *If you need a refill on your cardiac medications before your next appointment, please call your pharmacy*  Lab Work: NONE  Testing/Procedures: Your physician has requested that you have an exercise tolerance test, this is a screening tool to track your fitness level. This test evaluates the your exercise capacity by measuring cardiovascular response to exercise, the stress response is induced by exercise (exercise-treadmill).  Graded exercise test is also known as maximal exercise test or stress EKG test  . Please also follow instruction sheet given.  Echocardiogram - Your physician has requested that you have an echocardiogram. Echocardiography is a painless test that uses sound waves to create images of your heart. It provides your doctor with information about the size and shape of your heart and how well your heart's chambers and valves are working. This procedure takes approximately one hour. There are no restrictions for this procedure. This will be performed at our [facility] location - [address].  Special Instructions PLEASE READ AND FOLLOW SALTY 6-ATTACHED  PLEASE INCREASE PHYSICAL ACTIVITY AS TOLERATED  Follow-Up: Your next appointment:  1 month(s) In Person with Skeet Latch, MD  02-23-2020 @820AM   At [facility], you and your health needs are our priority.  As part of our continuing mission to provide you with exceptional heart care, we have created designated Provider Care Teams.  These Care Teams include your primary Cardiologist (physician) and Advanced Practice Providers (APPs -  Physician Assistants and Nurse Practitioners) who all work together to provide you with the care you need, when you need it.  We recommend signing up for the patient portal called "MyChart".  Sign  up information is provided on this After Visit Summary.  MyChart is used to connect with patients for Virtual Visits (Telemedicine).  Patients are able to view lab/test results, encounter notes, upcoming appointments, etc.  Non-urgent messages can be sent to your provider as well.   To learn more about what you can do with MyChart, go to [email or web address].

## 2020-01-28 ENCOUNTER — Ambulatory Visit: Payer: 59 | Admitting: Internal Medicine

## 2020-02-08 ENCOUNTER — Other Ambulatory Visit: Payer: Self-pay | Admitting: Obstetrics and Gynecology

## 2020-02-08 DIAGNOSIS — Z1231 Encounter for screening mammogram for malignant neoplasm of breast: Secondary | ICD-10-CM

## 2020-02-11 ENCOUNTER — Ambulatory Visit (HOSPITAL_COMMUNITY): Payer: 59 | Attending: Cardiology

## 2020-02-11 ENCOUNTER — Telehealth: Payer: Self-pay

## 2020-02-11 ENCOUNTER — Other Ambulatory Visit: Payer: Self-pay

## 2020-02-11 DIAGNOSIS — R5383 Other fatigue: Secondary | ICD-10-CM | POA: Insufficient documentation

## 2020-02-11 DIAGNOSIS — R0789 Other chest pain: Secondary | ICD-10-CM | POA: Insufficient documentation

## 2020-02-11 NOTE — Telephone Encounter (Signed)
pt left VM on 9/28 to state that she wanted to advise Dr. Baird Cancer that she was canceling her appointment because she refused to undergo "unsafe" practices at the front door.  She admitted to refusing to allow the staff to take her temperature in  these COVID conditions and wanted to a return call.  I called the patient back and she stated that she admitted to refusing to be seen on the day of her appointment because she felt that temperature screening was unnecessary and would not return as long as we were performing screening at the door using an oral thermometer.

## 2020-02-12 LAB — ECHOCARDIOGRAM COMPLETE
Area-P 1/2: 2.99 cm2
S' Lateral: 2.45 cm

## 2020-02-16 ENCOUNTER — Other Ambulatory Visit: Payer: Self-pay

## 2020-02-16 ENCOUNTER — Ambulatory Visit
Admission: RE | Admit: 2020-02-16 | Discharge: 2020-02-16 | Disposition: A | Discharge status: Home / Self Care | Payer: 59 | Source: Ambulatory Visit | Attending: Obstetrics and Gynecology | Admitting: Obstetrics and Gynecology

## 2020-02-16 DIAGNOSIS — Z1231 Encounter for screening mammogram for malignant neoplasm of breast: Secondary | ICD-10-CM

## 2020-02-17 ENCOUNTER — Telehealth: Payer: Self-pay

## 2020-02-17 NOTE — Telephone Encounter (Signed)
Pa sent to plan for nurtec

## 2020-02-23 ENCOUNTER — Encounter: Payer: Self-pay | Admitting: Cardiovascular Disease

## 2020-02-23 ENCOUNTER — Ambulatory Visit (INDEPENDENT_AMBULATORY_CARE_PROVIDER_SITE_OTHER): Payer: 59 | Admitting: Cardiovascular Disease

## 2020-02-23 ENCOUNTER — Other Ambulatory Visit: Payer: Self-pay

## 2020-02-23 VITALS — BP 126/76 | HR 68 | Ht 62.0 in | Wt 205.8 lb

## 2020-02-23 DIAGNOSIS — R12 Heartburn: Secondary | ICD-10-CM

## 2020-02-23 DIAGNOSIS — Z6837 Body mass index (BMI) 37.0-37.9, adult: Secondary | ICD-10-CM | POA: Diagnosis not present

## 2020-02-23 DIAGNOSIS — E669 Obesity, unspecified: Secondary | ICD-10-CM | POA: Diagnosis not present

## 2020-02-23 DIAGNOSIS — R0789 Other chest pain: Secondary | ICD-10-CM | POA: Diagnosis not present

## 2020-02-23 NOTE — Patient Instructions (Signed)
Medication Instructions:  Your physician recommends that you continue on your current medications as directed. Please refer to the Current Medication list given to you today.   *If you need a refill on your cardiac medications before your next appointment, please call your pharmacy*  Lab Work: NONE  Testing/Procedures: NONE  Follow-Up: AS NEEDED    

## 2020-02-23 NOTE — Progress Notes (Signed)
Cardiology Office Note   Date:  02/23/2020   ID:  Michelle Mcneil, DOB [birth year], MRN [id number]  PCP:  Michelle Chard, MD  Cardiologist:   Michelle Latch, MD   No chief complaint on file.    History of Present Illness: Michelle Mcneil is a 54 y.o. female with GERD who presents for an evaluation of chest pain.  Michelle Mcneil saw Dr. [place] Mcneil on 11/27/15 and reported chest pain.  She was seen in 2017 reported atypical chest pain.  She was referred for an ETT that was negative for ischemia.  She recently followed up with Michelle Mcneil 12/2019.  At that time she reported her current atypical chest pain and fatigue.  She had an echo 01/2020 that revealed normal systolic function and was otherwise unremarkable.  She didn't have an ETT.  She notes that every two years she gets chest pain that feels heart pain.  She notes that she is laying in bed and feels a stabbing pain that radiates down her arm.  It lasted for about two weeks and then went away.  She denies any exertional symptoms.  She walks in the morning and jogs at times.  She has no exertional chest pain or shortness of breath.  She notes that she does have GERD.   Past Medical History:  Diagnosis Date  . Adenomatous colon polyp   . Anemia   . Atypical chest pain 01/06/2016   saw cardiologist - EKG normal-brady, ETT Normal  . Bradycardia 01/06/2016  . GERD (gastroesophageal reflux disease)    no meds, diet controlled  . Pneumonia    as a child  . SVD (spontaneous vaginal delivery)    x 2    Past Surgical History:  Procedure Laterality Date  . BREAST EXCISIONAL BIOPSY Right 06/03/2013  . BREAST LUMPECTOMY WITH NEEDLE LOCALIZATION Right 06/03/2013   Procedure: BREAST LUMPECTOMY WITH NEEDLE LOCALIZATION;  Surgeon: Michelle Moores A. Cornett, MD;  Location: [facility];  Service: General;  Laterality: Right;  . BREAST SURGERY Right    lumpectomy  . cerclage stitch    . COLONOSCOPY  01/21/2012   Procedure: COLONOSCOPY;  Surgeon: Michelle Dragon, MD;  Location: [facility];  Service: Endoscopy;  Laterality: N/A;  . DILATATION & CURETTAGE/HYSTEROSCOPY WITH MYOSURE N/A 03/10/2018   Procedure: DILATATION & CURETTAGE/HYSTEROSCOPY WITH MYOSURE;  Surgeon: Michelle Salina, MD;  Location: [facility];  Service: Gynecology;  Laterality: N/A;  . EYE SURGERY Bilateral    Lasik  . HAND SURGERY Right    trigger finer - thumb  . TUBAL LIGATION    . UPPER GI ENDOSCOPY    . WISDOM TOOTH EXTRACTION  1993     Current Outpatient Medications  Medication Sig Dispense Refill  . acetaminophen (TYLENOL) 500 MG tablet Take 500 mg by mouth every 6 (six) hours as needed (for pain.).     Marland Kitchen b complex vitamins capsule Take 1 capsule by mouth daily.    Marland Kitchen BLACK CURRANT SEED OIL PO Take 1,000 mg by mouth daily.     . Cholecalciferol (VITAMIN D) 50 MCG (2000 UT) CAPS Take by mouth.    Marland Kitchen ibuprofen (ADVIL,MOTRIN) 200 MG tablet Take 400 mg by mouth every 8 (eight) hours as needed (for pain.).     Marland Kitchen Rimegepant Sulfate (NURTEC) 75 MG TBDP ONE TAB PO QD PRN MIGRAINES 10 tablet 1   No current facility-administered medications for this visit.    Allergies:   Patient has no known allergies.  Social History:  The patient  reports that she has never smoked. She has never used smokeless tobacco. She reports that she does not drink alcohol and does not use drugs.   Family History:  The patient's family history includes Alzheimer's disease in her mother; Breast cancer in her sister; Cancer in her maternal aunt and son; Colon cancer in her maternal aunt; Colon polyps in her mother; Dementia in her father; Diabetes in her father; Heart attack in her mother; Hypertension in her father.    ROS:  Please see the history of present illness.   Otherwise, review of systems are positive for none.   All other systems are reviewed and negative.    PHYSICAL EXAM: VS:  BP 126/76   Pulse 68   Ht 5\' 2"  (1.575 m)   Wt 205 lb 12.8 oz (93.4 kg)   LMP 01/29/2020   SpO2 99%    BMI 37.64 kg/m  , BMI Body mass index is 37.64 kg/m. GENERAL:  Well appearing HEENT:  Pupils equal round and reactive, fundi not visualized, oral mucosa unremarkable NECK:  No jugular venous distention, waveform within normal limits, carotid upstroke brisk and symmetric, no bruits, no thyromegaly LYMPHATICS:  No cervical adenopathy LUNGS:  Clear to auscultation bilaterally HEART:  Bradycardic. Regular rhythmPMI not displaced or sustained,S1 and S2 within normal limits, no S3, no S4, no clicks, no rubs, no murmurs ABD:  Flat, positive bowel sounds normal in frequency in pitch, no bruits, no rebound, no guarding, no midline pulsatile mass, no hepatomegaly, no splenomegaly EXT:  2 plus pulses throughout, no edema, no cyanosis no clubbing SKIN:  No rashes no nodules NEURO:  Cranial nerves II through XII grossly intact, motor grossly intact throughout PSYCH:  Cognitively intact, oriented to person place and time  EKG:  EKG is ordered today. The ekg ordered 01/06/2016 demonstrates sinus bradycardia. Rate 49 bpm. Low voltage precordial leads. 02/23/2020: Sinus rhythm.  Rate 68 bpm.  Low voltage.  ETT 12/2015:  There was no ST segment deviation noted during stress.  10 minutes and 33 seconds-good exercise time. Normal blood pressure response.  Low risk exercise treadmill test with no evidence of ischemia  Echo 01/2020: 1. Possible density in liver; suggest dedicated liver ultrasound to  further assess.  2. Left ventricular ejection fraction, by estimation, is 60 to 65%. The  left ventricle has normal function. The left ventricle has no regional  wall motion abnormalities. Left ventricular diastolic parameters were  normal.  3. Right ventricular systolic function is normal. The right ventricular  size is normal. Tricuspid regurgitation signal is inadequate for assessing  PA pressure.  4. The mitral valve is normal in structure. No evidence of mitral valve  regurgitation. No evidence of  mitral stenosis.  5. The aortic valve is tricuspid. Aortic valve regurgitation is not  visualized. No aortic stenosis is present.  6. Aortic dilatation noted. There is mild dilatation of the ascending  aorta, measuring 39 mm.  7. The inferior vena cava is normal in size with greater than 50%  respiratory variability, suggesting right atrial pressure of 3 mmHg.   Recent Labs: 10/26/2019: ALT 8; BUN 13; Creatinine, Ser 0.91; Hemoglobin 11.9; Platelets 256; Potassium 4.5; Sodium 141   11/21/15: TSH 1.54 WBC 8.2, hemoglobin 12.8, hematocrit 40.4, platelets 337 Sodium 139, potassium 4.6, BUN 11, creatinine 0.7 AST 16, ALT 8  Lipid Panel    Component Value Date/Time   CHOL 173 10/26/2019 1007   TRIG 88 10/26/2019 1007  HDL 67 10/26/2019 1007   CHOLHDL 2.6 10/26/2019 1007   LDLCALC 90 10/26/2019 1007      Wt Readings from Last 3 Encounters:  02/23/20 205 lb 12.8 oz (93.4 kg)  10/26/19 198 lb 6.4 oz (90 kg)  10/20/18 200 lb (90.7 kg)      ASSESSMENT AND PLAN:  # Atypical chest pain: Her symptoms seem most consistent with GERD.  Her echo showed normal systolic function and diastolic function.  We discussed the options of getting a coronary calcium score coronary CT.  Given that her symptoms are so classic for GERD and that she exercises regularly without symptoms.  She elected to defer any further ischemic evaluation.  We did discuss ways to limit her GERD such as weight loss, avoiding spicy foods, red sauces, peppermint, and chocolate.  We also discussed elevating the head of her bed and not going to bed soon after eating.  # Bradycardia: Resolved  Current medicines are reviewed at length with the patient today.  The patient does not have concerns regarding medicines.  The following changes have been made:  no change  Labs/ tests ordered today include:   No orders of the defined types were placed in this encounter.    Disposition:   FU with Carrel Leather C. Oval Linsey, MD, [facility]  as needed.     This note was written with the assistance of speech recognition software.  Please excuse any transcriptional errors.  Signed, Deneen Slager C. Oval Linsey, MD, [facility]  02/23/2020 9:38 AM    [facility]

## 2020-03-08 ENCOUNTER — Other Ambulatory Visit: Payer: Self-pay

## 2020-03-08 ENCOUNTER — Ambulatory Visit (LOCAL_COMMUNITY_HEALTH_CENTER): Payer: 59

## 2020-03-08 DIAGNOSIS — Z111 Encounter for screening for respiratory tuberculosis: Secondary | ICD-10-CM

## 2020-03-08 DIAGNOSIS — Z23 Encounter for immunization: Secondary | ICD-10-CM

## 2020-03-08 NOTE — Progress Notes (Signed)
PPD placed today. Heplisav given, tolerated well. Plans to have varicella vaccine and 2nd part ppd at next visit in approx 1-3 weeks. Questions answered. Josie Saunders, RN

## 2020-03-11 ENCOUNTER — Ambulatory Visit (LOCAL_COMMUNITY_HEALTH_CENTER): Payer: 59

## 2020-03-11 ENCOUNTER — Other Ambulatory Visit: Payer: Self-pay

## 2020-03-11 DIAGNOSIS — Z111 Encounter for screening for respiratory tuberculosis: Secondary | ICD-10-CM

## 2020-03-11 LAB — TB SKIN TEST
Induration: 0 mm
TB Skin Test: NEGATIVE

## 2020-03-18 ENCOUNTER — Other Ambulatory Visit: Payer: Self-pay

## 2020-03-18 ENCOUNTER — Ambulatory Visit (LOCAL_COMMUNITY_HEALTH_CENTER): Payer: Self-pay

## 2020-03-18 DIAGNOSIS — Z111 Encounter for screening for respiratory tuberculosis: Secondary | ICD-10-CM

## 2020-03-21 ENCOUNTER — Other Ambulatory Visit: Payer: Self-pay

## 2020-03-21 ENCOUNTER — Ambulatory Visit (LOCAL_COMMUNITY_HEALTH_CENTER): Payer: 59

## 2020-03-21 DIAGNOSIS — Z111 Encounter for screening for respiratory tuberculosis: Secondary | ICD-10-CM

## 2020-03-21 DIAGNOSIS — Z23 Encounter for immunization: Secondary | ICD-10-CM

## 2020-03-21 LAB — TB SKIN TEST
Induration: 0 mm
TB Skin Test: NEGATIVE

## 2020-03-21 NOTE — Progress Notes (Signed)
PPDR 0 mm, Negative. Requests Varicella as requirement for school. Denies any history of ever having  Chickenpox. Tolerated varicella well today. Updated [facility] copy given and recommended schedule explained. Josie Saunders, RN

## 2020-04-19 ENCOUNTER — Telehealth: Payer: Self-pay

## 2020-04-19 NOTE — Telephone Encounter (Signed)
Spoke with patient about nurtec. Medication is not covered by insurance. I attempted to give pharmacist savings card but card would not work. Patient is understanding

## 2020-06-07 ENCOUNTER — Telehealth: Payer: Self-pay

## 2020-06-07 NOTE — Telephone Encounter (Signed)
[place]

## 2020-06-07 NOTE — Telephone Encounter (Signed)
Pt stated her insurance will not cover her nurtec medication for her migraines cost $800 out of pocket, is there something else that can be prescribed for her? Pt stated she is willing to do a virtual appt, she asked for an appt with your NP first.

## 2020-06-07 NOTE — Telephone Encounter (Signed)
She can see Michelle Mcneil.

## 2020-06-08 ENCOUNTER — Telehealth: Payer: Self-pay

## 2020-06-08 NOTE — Telephone Encounter (Signed)
Pt was given a virtual appt for 06/13/20 and was told her next visit with Minette Brine would have to be a face to face. Pt also wanted to know if we had samples of Nurtec. I told her the nurse would call her back about the samples.

## 2020-06-08 NOTE — Telephone Encounter (Signed)
Pt was notified samples of nurtec ready for pickup.

## 2020-06-13 ENCOUNTER — Telehealth (INDEPENDENT_AMBULATORY_CARE_PROVIDER_SITE_OTHER): Payer: 59 | Admitting: Nurse Practitioner

## 2020-06-13 ENCOUNTER — Other Ambulatory Visit: Payer: Self-pay

## 2020-06-13 ENCOUNTER — Encounter: Payer: Self-pay | Admitting: Nurse Practitioner

## 2020-06-13 VITALS — BP 130/80 | HR 75 | Temp 97.7°F | Ht 61.0 in | Wt 189.0 lb

## 2020-06-13 DIAGNOSIS — G43829 Menstrual migraine, not intractable, without status migrainosus: Secondary | ICD-10-CM

## 2020-06-13 MED ORDER — SUMATRIPTAN SUCCINATE 100 MG PO TABS: 100.0000 mg | ORAL_TABLET | Freq: Once | ORAL | 2 refills | Status: DC | PRN

## 2020-06-13 NOTE — Patient Instructions (Signed)

## 2020-06-13 NOTE — Progress Notes (Signed)
Virtual Visit via My Chart   I,Michelle Mcneil,acting as a Education administrator for Minette Brine, FNP.,have documented all relevant documentation on the behalf of Minette Brine, FNP,as directed by  Minette Brine, FNP while in the presence of Minette Brine, [place].  This visit type was conducted due to national recommendations for restrictions regarding the COVID-19 Pandemic (e.g. social distancing) in an effort to limit this patient's exposure and mitigate transmission in our community.  Due to her co-morbid illnesses, this patient is at least at moderate risk for complications without adequate follow up.  This format is felt to be most appropriate for this patient at this time.  All issues noted in this document were discussed and addressed.  A limited physical exam was performed with this format.    This visit type was conducted due to national recommendations for restrictions regarding the COVID-19 Pandemic (e.g. social distancing) in an effort to limit this patient's exposure and mitigate transmission in our community.  Patients identity confirmed using two different identifiers.  This format is felt to be most appropriate for this patient at this time.  All issues noted in this document were discussed and addressed.  No physical exam was performed (except for noted visual exam findings with Video Visits).    Date:  06/13/2020   ID:  Michelle Mcneil, DOB 06-22-1965, MRN [id number]  Patient Location:  Home - spoke with Rosita Fire  Provider location:   Office    Chief Complaint:  Migraine  History of Present Illness:    Michelle Mcneil is a 55 y.o. female who presents via video conferencing for a telehealth visit today.    The patient does not have symptoms concerning for COVID-19 infection (fever, chills, cough, or new shortness of breath).   She has been on Nurtec which was effective  - she was receiving samples. The headache is gone in within an hour.  She gets a migraine around her cycle. Her  LMP was the first week in February lasting 5 days.  Denies currently having a headache.  Migraine  This is a chronic problem. The current episode started more than 1 year ago. The problem occurs intermittently. The pain quality is similar to prior headaches. The quality of the pain is described as aching. Pertinent negatives include no dizziness. Treatments tried: Nurtec. The treatment provided significant relief. Her past medical history is significant for migraine headaches and obesity. There is no history of cancer, cluster headaches or hypertension.     Past Medical History:  Diagnosis Date  . Adenomatous colon polyp   . Anemia   . Atypical chest pain 01/06/2016   saw cardiologist - EKG normal-brady, ETT Normal  . Bradycardia 01/06/2016  . GERD (gastroesophageal reflux disease)    no meds, diet controlled  . Pneumonia    as a child  . SVD (spontaneous vaginal delivery)    x 2   Past Surgical History:  Procedure Laterality Date  . BREAST EXCISIONAL BIOPSY Right 06/03/2013  . BREAST LUMPECTOMY WITH NEEDLE LOCALIZATION Right 06/03/2013   Procedure: BREAST LUMPECTOMY WITH NEEDLE LOCALIZATION;  Surgeon: Marcello Moores A. Cornett, MD;  Location: [place];  Service: General;  Laterality: Right;  . BREAST SURGERY Right    lumpectomy  . cerclage stitch    . COLONOSCOPY  01/21/2012   Procedure: COLONOSCOPY;  Surgeon: Lafayette Dragon, MD;  Location: [facility];  Service: Endoscopy;  Laterality: N/A;  . DILATATION & CURETTAGE/HYSTEROSCOPY WITH MYOSURE N/A 03/10/2018   Procedure: DILATATION &  CURETTAGE/HYSTEROSCOPY WITH MYOSURE;  Surgeon: Servando Salina, MD;  Location: [facility];  Service: Gynecology;  Laterality: N/A;  . EYE SURGERY Bilateral    Lasik  . HAND SURGERY Right    trigger finer - thumb  . TUBAL LIGATION    . UPPER GI ENDOSCOPY    . WISDOM TOOTH EXTRACTION  1993     Current Meds  Medication Sig  . b complex vitamins capsule Take 1 capsule by mouth daily.  . Cholecalciferol (VITAMIN  D) 50 MCG (2000 UT) CAPS Take by mouth.  Marland Kitchen ibuprofen (ADVIL,MOTRIN) 200 MG tablet Take 400 mg by mouth every 8 (eight) hours as needed (for pain.).   Marland Kitchen SUMAtriptan (IMITREX) 100 MG tablet Take 1 tablet (100 mg total) by mouth once as needed for migraine. May repeat in 2 hours if headache persists or recurs.  . [DISCONTINUED] Rimegepant Sulfate (NURTEC) 75 MG TBDP ONE TAB PO QD PRN MIGRAINES     Allergies:   Patient has no known allergies.   Social History   Tobacco Use  . Smoking status: Never Smoker  . Smokeless tobacco: Never Used  Vaping Use  . Vaping Use: Never used  Substance Use Topics  . Alcohol use: No  . Drug use: No     Family Hx: The patient's family history includes Alzheimer's disease in her mother; Breast cancer in her sister; Cancer in her maternal aunt and son; Colon cancer in her maternal aunt; Colon polyps in her mother; Dementia in her father; Diabetes in her father; Heart attack in her mother; Hypertension in her father.  ROS:   Please see the history of present illness.    Review of Systems  Constitutional: Negative.   Respiratory: Negative.   Cardiovascular: Negative.   Neurological: Positive for headaches (intermittent headache the week prior to her menstrual cycle). Negative for dizziness.  Psychiatric/Behavioral: Negative.     All other systems reviewed and are negative.   Labs/Other Tests and Data Reviewed:    Recent Labs: 10/26/2019: ALT 8; BUN 13; Creatinine, Ser 0.91; Hemoglobin 11.9; Platelets 256; Potassium 4.5; Sodium 141   Recent Lipid Panel Lab Results  Component Value Date/Time   CHOL 173 10/26/2019 10:07 AM   TRIG 88 10/26/2019 10:07 AM   HDL 67 10/26/2019 10:07 AM   CHOLHDL 2.6 10/26/2019 10:07 AM   LDLCALC 90 10/26/2019 10:07 AM    Wt Readings from Last 3 Encounters:  06/13/20 189 lb (85.7 kg)  02/23/20 205 lb 12.8 oz (93.4 kg)  10/26/19 198 lb 6.4 oz (90 kg)     Exam:    Vital Signs:  BP 130/80 (BP Location: Right Arm,  Patient Position: Sitting, Cuff Size: Small)   Pulse 75   Temp 97.7 F (36.5 C) (Oral)   Ht 5\' 1"  (1.549 m)   Wt 189 lb (85.7 kg)   LMP 05/31/2020   BMI 35.71 kg/m     Physical Exam Constitutional:      General: She is not in acute distress.    Appearance: Normal appearance. She is obese.  Pulmonary:     Effort: Pulmonary effort is normal. No respiratory distress.  Neurological:     General: No focal deficit present.     Mental Status: She is alert and oriented to person, place, and time.  Psychiatric:        Mood and Affect: Mood normal.        Behavior: Behavior normal.        Thought Content: Thought content normal.  Judgment: Judgment normal.     ASSESSMENT & PLAN:    1. Menstrual migraine without status migrainosus, not intractable Her insurance is not covering Nurtec (cost is $800), will try her on sumatriptan, instructions given if not effective with first dose to repeat in 2 hours. If this is not effective will refer to headache specialist. - SUMAtriptan (IMITREX) 100 MG tablet; Take 1 tablet (100 mg total) by mouth once as needed for migraine. May repeat in 2 hours if headache persists or recurs.  Dispense: 10 tablet; Refill: 2   COVID-19 Education: The signs and symptoms of COVID-19 were discussed with the patient and how to seek care for testing (follow up with PCP or arrange E-visit).  The importance of social distancing was discussed today.  Patient Risk:   After full review of this patients clinical status, I feel that they are at least moderate risk at this time.  Time:   Today, I have spent 8 minutes/ seconds with the patient with telehealth technology discussing above diagnoses.     Medication Adjustments/Labs and Tests Ordered: Current medicines are reviewed at length with the patient today.  Concerns regarding medicines are outlined above.   Tests Ordered: No orders of the defined types were placed in this encounter.   Medication  Changes: Meds ordered this encounter  Medications  . SUMAtriptan (IMITREX) 100 MG tablet    Sig: Take 1 tablet (100 mg total) by mouth once as needed for migraine. May repeat in 2 hours if headache persists or recurs.    Dispense:  10 tablet    Refill:  2    Disposition:  Follow up prn  Signed, Minette Brine, FNP

## 2020-07-28 ENCOUNTER — Other Ambulatory Visit: Payer: Self-pay | Admitting: Specialist

## 2020-07-28 DIAGNOSIS — G43709 Chronic migraine without aura, not intractable, without status migrainosus: Secondary | ICD-10-CM

## 2020-07-28 DIAGNOSIS — G4452 New daily persistent headache (NDPH): Secondary | ICD-10-CM

## 2020-08-24 ENCOUNTER — Other Ambulatory Visit: Payer: Self-pay

## 2020-08-24 ENCOUNTER — Ambulatory Visit
Admission: RE | Admit: 2020-08-24 | Discharge: 2020-08-24 | Disposition: A | Discharge status: Home / Self Care | Payer: 59 | Source: Ambulatory Visit | Attending: Specialist | Admitting: Specialist

## 2020-08-24 DIAGNOSIS — G4452 New daily persistent headache (NDPH): Secondary | ICD-10-CM

## 2020-08-24 DIAGNOSIS — G43709 Chronic migraine without aura, not intractable, without status migrainosus: Secondary | ICD-10-CM

## 2020-08-24 MED ORDER — GADOBENATE DIMEGLUMINE 529 MG/ML IV SOLN
17.0000 mL | Freq: Once | INTRAVENOUS | Status: AC | PRN
  Administered 2020-08-24: 17 mL via INTRAVENOUS

## 2020-09-28 ENCOUNTER — Ambulatory Visit (LOCAL_COMMUNITY_HEALTH_CENTER): Payer: Self-pay

## 2020-09-28 ENCOUNTER — Other Ambulatory Visit: Payer: Self-pay

## 2020-09-28 DIAGNOSIS — Z0184 Encounter for antibody response examination: Secondary | ICD-10-CM

## 2020-09-28 NOTE — Progress Notes (Signed)
In Nurse Clinic for Hep B titer (quantitative) as needed for nursing school. Per [facility], has completed Hep B series 2021. ROI signed. RN to call pt [phone]) with results. In addition, pt says she has My Chart portal and will look for results there. RN walked pt to lab for blood draw. Josie Saunders, RN

## 2020-09-29 ENCOUNTER — Telehealth: Payer: Self-pay

## 2020-09-29 LAB — HEPATITIS B SURFACE ANTIBODY, QUANTITATIVE: Hepatitis B Surf Ab Quant: >1000 m[IU]/mL (ref >9.9)

## 2020-09-29 NOTE — Telephone Encounter (Signed)
-----   Message from Eduard Clos, RN sent at 09/29/2020  8:05 AM EDT -----  ----- Message ----- From: Interface, [facility] Lab Results In Sent: 09/29/2020   4:36 AM EDT To: [facility]-Results

## 2020-09-29 NOTE — Telephone Encounter (Signed)
Phone call to pt. Pt counseled about Hep B results, indicates immunity. Pt states she will access the results in My Chart and does not need a copy left at the reception desk.

## 2020-11-03 ENCOUNTER — Encounter: Payer: 59 | Admitting: Nurse Practitioner

## 2021-04-03 ENCOUNTER — Other Ambulatory Visit: Payer: Self-pay | Admitting: Obstetrics and Gynecology

## 2021-04-03 DIAGNOSIS — Z1231 Encounter for screening mammogram for malignant neoplasm of breast: Secondary | ICD-10-CM

## 2021-05-04 ENCOUNTER — Ambulatory Visit
Admission: RE | Admit: 2021-05-04 | Discharge: 2021-05-04 | Disposition: A | Discharge status: Home / Self Care | Payer: 59 | Source: Ambulatory Visit | Attending: Obstetrics and Gynecology | Admitting: Obstetrics and Gynecology

## 2021-05-04 ENCOUNTER — Other Ambulatory Visit: Payer: Self-pay

## 2021-05-04 DIAGNOSIS — Z1231 Encounter for screening mammogram for malignant neoplasm of breast: Secondary | ICD-10-CM

## 2021-07-06 ENCOUNTER — Other Ambulatory Visit: Payer: Self-pay | Admitting: Obstetrics and Gynecology

## 2021-07-06 DIAGNOSIS — R2232 Localized swelling, mass and lump, left upper limb: Secondary | ICD-10-CM

## 2021-07-24 ENCOUNTER — Ambulatory Visit
Admission: RE | Admit: 2021-07-24 | Discharge: 2021-07-24 | Disposition: A | Discharge status: Home / Self Care | Payer: 59 | Source: Ambulatory Visit | Attending: Obstetrics and Gynecology | Admitting: Obstetrics and Gynecology

## 2021-07-24 ENCOUNTER — Other Ambulatory Visit: Payer: Self-pay

## 2021-07-24 ENCOUNTER — Other Ambulatory Visit: Payer: Self-pay | Admitting: Obstetrics and Gynecology

## 2021-07-24 DIAGNOSIS — R2232 Localized swelling, mass and lump, left upper limb: Secondary | ICD-10-CM

## 2022-06-28 ENCOUNTER — Other Ambulatory Visit (HOSPITAL_COMMUNITY): Payer: Self-pay | Admitting: Gastroenterology

## 2022-06-28 DIAGNOSIS — R1033 Periumbilical pain: Secondary | ICD-10-CM

## 2022-07-17 ENCOUNTER — Ambulatory Visit (HOSPITAL_COMMUNITY): Payer: 59

## 2022-07-17 ENCOUNTER — Ambulatory Visit (HOSPITAL_COMMUNITY)
Admission: RE | Admit: 2022-07-17 | Discharge: 2022-07-17 | Disposition: A | Discharge status: Home / Self Care | Payer: 59 | Source: Ambulatory Visit | Attending: Gastroenterology | Admitting: Gastroenterology

## 2022-07-17 DIAGNOSIS — R1033 Periumbilical pain: Secondary | ICD-10-CM | POA: Insufficient documentation

## 2022-07-17 MED ORDER — IOHEXOL 350 MG/ML SOLN
75.0000 mL | Freq: Once | INTRAVENOUS | Status: AC | PRN
  Administered 2022-07-17: 75 mL via INTRAVENOUS

## 2022-10-13 ENCOUNTER — Other Ambulatory Visit: Payer: Self-pay

## 2022-10-13 ENCOUNTER — Encounter (HOSPITAL_COMMUNITY): Payer: Self-pay

## 2022-10-13 ENCOUNTER — Emergency Department (HOSPITAL_COMMUNITY): Payer: 59

## 2022-10-13 ENCOUNTER — Emergency Department (HOSPITAL_COMMUNITY)
Admission: EM | Admit: 2022-10-13 | Discharge: 2022-10-13 | Disposition: A | Discharge status: Home / Self Care | Payer: 59 | Attending: Emergency Medicine | Admitting: Emergency Medicine

## 2022-10-13 DIAGNOSIS — W010XXA Fall on same level from slipping, tripping and stumbling without subsequent striking against object, initial encounter: Secondary | ICD-10-CM | POA: Diagnosis not present

## 2022-10-13 DIAGNOSIS — Y9301 Activity, walking, marching and hiking: Secondary | ICD-10-CM | POA: Insufficient documentation

## 2022-10-13 DIAGNOSIS — S42202A Unspecified fracture of upper end of left humerus, initial encounter for closed fracture: Secondary | ICD-10-CM | POA: Insufficient documentation

## 2022-10-13 DIAGNOSIS — M25512 Pain in left shoulder: Secondary | ICD-10-CM | POA: Diagnosis present

## 2022-10-13 MED ORDER — FENTANYL CITRATE PF 50 MCG/ML IJ SOSY
50.0000 ug | PREFILLED_SYRINGE | Freq: Once | INTRAMUSCULAR | Status: AC
  Administered 2022-10-13: 50 ug via INTRAVENOUS
  Filled 2022-10-13: qty 1

## 2022-10-13 MED ORDER — HYDROCODONE-ACETAMINOPHEN 5-325 MG PO TABS: 1.0000 | ORAL_TABLET | ORAL | 0 refills | Status: DC | PRN

## 2022-10-13 MED ORDER — SODIUM CHLORIDE 0.9 % IV BOLUS
1000.0000 mL | Freq: Once | INTRAVENOUS | Status: AC
  Administered 2022-10-13: 1000 mL via INTRAVENOUS

## 2022-10-13 MED ORDER — HYDROMORPHONE HCL 1 MG/ML IJ SOLN
1.0000 mg | Freq: Once | INTRAMUSCULAR | Status: AC
  Administered 2022-10-13: 1 mg via INTRAVENOUS
  Filled 2022-10-13: qty 1

## 2022-10-13 NOTE — Discharge Instructions (Addendum)
Michelle Mcneil:  Thank you for allowing [place] to take care of you today.  We hope you begin feeling better soon.  To-Do: Please follow-up with orthopedic surgery.  Call the number provided to set up an appointment. Take Tylenol 650 mg every 6 hours, ibuprofen 400 to 600 mg every 6 hours, lidocaine patches for pain.  If this does not control your pain take 1 Norco (keep in mind this medication has a small amount of Tylenol so do not take more than 650 mg of scheduled Tylenol in combination with it).  Please return to the Emergency Department or call 911 if you experience chest pain, shortness of breath, severe pain, severe fever, altered mental status, or have any reason to think that you need emergency medical care.  Thank you again.  Hope you feel better soon.  Department of Emergency Medicine [facility]

## 2022-10-13 NOTE — ED Notes (Signed)
Ortho tech at bedside 

## 2022-10-13 NOTE — ED Provider Notes (Signed)
Michelle Mcneil EMERGENCY DEPARTMENT AT [facility] Provider Note   HPI: Michelle Mcneil is a 57 year old female with a past medical history as below presenting after a fall with left shoulder pain.  She reports she was trying on close when she walked out tripped over a box/stool and landed on her left left shoulder.  She denies hitting her head or losing consciousness.  She is not on blood thinning medications.  She reports severe pain to the left shoulder.  She has not had any numbness or weakness in the left upper extremity.  She denies any history of shoulder dislocations or fractures.  EMS was called who gave her 100 mcg of fentanyl prior to arrival.  She reports her pain was initially improved before coming back.  Past Medical History:  Diagnosis Date   Adenomatous colon polyp    Anemia    Atypical chest pain 01/06/2016   saw cardiologist - EKG normal-brady, ETT Normal   Bradycardia 01/06/2016   GERD (gastroesophageal reflux disease)    no meds, diet controlled   Pneumonia    as a child   SVD (spontaneous vaginal delivery)    x 2    Past Surgical History:  Procedure Laterality Date   BREAST EXCISIONAL BIOPSY Right 06/03/2013   BREAST LUMPECTOMY WITH NEEDLE LOCALIZATION Right 06/03/2013   Procedure: BREAST LUMPECTOMY WITH NEEDLE LOCALIZATION;  Surgeon: Maisie Fus A. Cornett, MD;  Location: [facility] OR;  Service: General;  Laterality: Right;   BREAST SURGERY Right    lumpectomy   cerclage stitch     COLONOSCOPY  01/21/2012   Procedure: COLONOSCOPY;  Surgeon: Hart Carwin, MD;  Location: [facility] ENDOSCOPY;  Service: Endoscopy;  Laterality: N/A;   DILATATION & CURETTAGE/HYSTEROSCOPY WITH MYOSURE N/A 03/10/2018   Procedure: DILATATION & CURETTAGE/HYSTEROSCOPY WITH MYOSURE;  Surgeon: Maxie Better, MD;  Location: [facility] ORS;  Service: Gynecology;  Laterality: N/A;   EYE SURGERY Bilateral    Lasik   HAND SURGERY Right    trigger finer - thumb   TUBAL LIGATION     UPPER GI ENDOSCOPY      WISDOM TOOTH EXTRACTION  1993     Social History   Tobacco Use   Smoking status: Never   Smokeless tobacco: Never  Vaping Use   Vaping Use: Never used  Substance Use Topics   Alcohol use: No   Drug use: No      Review of Systems  A complete ROS was performed with pertinent positives/negatives noted in the HPI.   Vitals:   10/13/22 2030 10/13/22 2045  BP: 116/80 117/76  Pulse: 66 62  Resp: 14 (!) 22  Temp:    SpO2: 97% 92%    Physical Exam Vitals and nursing note reviewed.  Constitutional:      General: She is not in acute distress.    Appearance: She is well-developed.  Cardiovascular:     Rate and Rhythm: Normal rate and regular rhythm.     Pulses: Normal pulses.     Heart sounds: Normal heart sounds. No murmur heard.    No friction rub. No gallop.  Pulmonary:     Effort: Pulmonary effort is normal. No respiratory distress.     Breath sounds: Normal breath sounds. No stridor. No wheezing, rhonchi or rales.  Abdominal:     General: Abdomen is flat. There is no distension.     Palpations: Abdomen is soft.     Tenderness: There is no abdominal tenderness. There is no guarding or  rebound.  Musculoskeletal:        General: No swelling.     Comments: Tenderness palpation to the left shoulder.  No humerus, elbow, forearm, wrist, hand tenderness to palpation.  Left upper extremity is neurovascular intact with good distal pulses, sensation, capillary fill, and motor function.  No midline C-spine tenderness.  Skin:    General: Skin is warm and dry.     Capillary Refill: Capillary refill takes less than 2 seconds.  Neurological:     Mental Status: She is alert.  Psychiatric:        Mood and Affect: Mood normal.     Procedures  MDM:  Imaging/radiology results:  DG Shoulder Left Portable  Result Date: 10/13/2022 CLINICAL DATA:  Fall with left shoulder pain EXAM: LEFT SHOULDER COMPARISON:  None. FINDINGS: There is an acute nondisplaced transverse fracture through  the neck of the humerus. There is also an acute fracture of the greater tuberosity with fracture fragment displaced 12 mm laterally. There is no dislocation. There is surrounding soft tissue swelling. IMPRESSION: Acute fractures of the humeral neck and greater tuberosity as described. Electronically Signed   By: Darliss Cheney M.D.   On: 10/13/2022 17:53     Key medications administered in the ER:  Medications  fentaNYL (SUBLIMAZE) injection 50 mcg (50 mcg Intravenous Given 10/13/22 1745)  HYDROmorphone (DILAUDID) injection 1 mg (1 mg Intravenous Given 10/13/22 2000)  sodium chloride 0.9 % bolus 1,000 mL (0 mLs Intravenous Stopped 10/13/22 2050)   Medical decision making: -Vital signs stable. Patient afebrile, hemodynamically stable, and non-toxic appearing. -Patient's presentation is most consistent with acute complicated illness / injury requiring diagnostic workup.. Michelle Mcneil is a 57 y.o. female presenting to the emergency department with shoulder pain.  -Additional history obtained from EMS.  -Patient given fentanyl for pain. -On arrival, patient has an intact airway and bilateral breath sounds.  She is hemodynamically stable.  Secondary exam performed as above notable for focal pain to the left shoulder.  No additional areas of tenderness.  No chest wall or abdominal tenderness no spinal tenderness.  Due to lack of additional pain do not believe full trauma scans are negative.  She did not hit her head or lose consciousness and has no headaches do not believe CT head is indicated.  X-rays of the shoulder obtained which show proximal humerus fracture.  Patient's extremity is neurovascularly intact.  She has good distal pulses, sensation, capillary refill, and full motor function.  There is no evidence of dislocation on my interpretation of the images.  Upon reassessment, patient had a transient episode of bradycardia and hypotension and appears to have had a vasovagal episode.  Patient improved  shortly thereafter.  Given fluid bolus.  After fluid bolus, patient reports significant treatment.  She reports persistent pain therefore given dose of Dilaudid.  Patient placed in a sling.  Upon reassessment, patient with improvement in pain.  Patient is stable for discharge with orthopedic surgery follow-up.  I discussed strict return precautions for ED return.  Discussed her follow-up plan.  Discussed pain regimen of scheduled Tylenol, ibuprofen, lidocaine patches with as needed Norco.  Patient discharged.   Medical Decision Making Amount and/or Complexity of Data Reviewed Independent Historian: EMS Radiology: ordered and independent interpretation performed. Decision-making details documented in ED Course.  Risk Prescription drug management.     The plan for this patient was discussed with Dr. Adela Lank, who voiced agreement and who oversaw evaluation and treatment of this patient.  Michelle Mcneil  Ericka Pontiff, MD Emergency Medicine, PGY-3  Note: Dragon medical dictation software was used in the creation of this note.   Clinical Impression:  1. Closed fracture of proximal end of left humerus, unspecified fracture morphology, initial encounter     Rx / DC Orders ED Discharge Orders          Ordered    HYDROcodone-acetaminophen (NORCO/VICODIN) 5-325 MG tablet  Every 4 hours PRN        10/13/22 2054               Chase Caller, MD 10/13/22 2137    Melene Plan, DO 10/13/22 2244

## 2022-10-13 NOTE — ED Notes (Signed)
Called ortho tech and left message.

## 2022-10-13 NOTE — ED Triage Notes (Signed)
Patient arrives by EMS with c/o left shoulder pain.   Patient was trying on clothes and while walking out tripped over box/stool landing on left shoulder.  Denies other injury, did not hit head.  Patient not on blood thinners.    +PMS  Patient given 100 mcg fentanyl.   Patient reports pain 3/10 on arrival.

## 2022-10-13 NOTE — ED Notes (Addendum)
Pt had vagal episode while ortho was applying splint. BP dropped to 81/60 and hr 42 pt nauseous and diaphoretic. MD notified and fluids ordered.

## 2022-10-13 NOTE — Progress Notes (Signed)
Orthopedic Tech Progress Note Patient Details:  Michelle Mcneil Aug 17, 1965 [id number]  Ortho Devices Type of Ortho Device: Shoulder immobilizer Ortho Device/Splint Location: LUE Ortho Device/Splint Interventions: Ordered, Application, Adjustment   Post Interventions Patient Tolerated: Fair Instructions Provided: Care of device  [name] 10/13/2022, 7:21 PM

## 2022-10-18 ENCOUNTER — Encounter (HOSPITAL_BASED_OUTPATIENT_CLINIC_OR_DEPARTMENT_OTHER): Payer: Self-pay | Admitting: Orthopaedic Surgery

## 2022-10-18 ENCOUNTER — Other Ambulatory Visit: Payer: Self-pay

## 2022-10-25 ENCOUNTER — Ambulatory Visit (HOSPITAL_BASED_OUTPATIENT_CLINIC_OR_DEPARTMENT_OTHER): Admission: RE | Admit: 2022-10-25 | Payer: 59 | Source: Home / Self Care | Admitting: Orthopaedic Surgery

## 2022-10-25 SURGERY — SHOULDER ARTHROSCOPY WITH SUBACROMIAL DECOMPRESSION, ROTATOR CUFF REPAIR AND BICEP TENDON REPAIR
Anesthesia: General | Site: Shoulder | Laterality: Left

## 2022-11-29 ENCOUNTER — Ambulatory Visit: Payer: 59 | Admitting: Family Medicine

## 2022-11-29 ENCOUNTER — Encounter: Payer: Self-pay | Admitting: Family Medicine

## 2022-11-29 VITALS — BP 122/82 | HR 91 | Temp 98.4°F | Ht 62.8 in | Wt 205.4 lb

## 2022-11-29 DIAGNOSIS — Z6836 Body mass index (BMI) 36.0-36.9, adult: Secondary | ICD-10-CM | POA: Diagnosis not present

## 2022-11-29 DIAGNOSIS — E6609 Other obesity due to excess calories: Secondary | ICD-10-CM

## 2022-11-29 DIAGNOSIS — Z7689 Persons encountering health services in other specified circumstances: Secondary | ICD-10-CM | POA: Diagnosis not present

## 2022-11-29 DIAGNOSIS — Z2821 Immunization not carried out because of patient refusal: Secondary | ICD-10-CM | POA: Diagnosis not present

## 2022-11-29 DIAGNOSIS — Z23 Encounter for immunization: Secondary | ICD-10-CM

## 2022-11-29 MED ORDER — SEMAGLUTIDE-WEIGHT MANAGEMENT 0.5 MG/0.5ML ~~LOC~~ SOAJ: 0.5000 mg | SUBCUTANEOUS | 0 refills | Status: AC

## 2022-11-29 MED ORDER — SEMAGLUTIDE-WEIGHT MANAGEMENT 1.7 MG/0.75ML ~~LOC~~ SOAJ: 1.7000 mg | SUBCUTANEOUS | 0 refills | Status: DC

## 2022-11-29 MED ORDER — SEMAGLUTIDE-WEIGHT MANAGEMENT 2.4 MG/0.75ML ~~LOC~~ SOAJ: 2.4000 mg | SUBCUTANEOUS | 0 refills | Status: DC

## 2022-11-29 MED ORDER — SEMAGLUTIDE-WEIGHT MANAGEMENT 1 MG/0.5ML ~~LOC~~ SOAJ: 1.0000 mg | SUBCUTANEOUS | 0 refills | Status: DC

## 2022-11-29 MED ORDER — SEMAGLUTIDE-WEIGHT MANAGEMENT 0.25 MG/0.5ML ~~LOC~~ SOAJ
0.2500 mg | SUBCUTANEOUS | 0 refills | Status: AC
Start: 2022-11-29 — End: 2022-12-27

## 2022-11-29 NOTE — Progress Notes (Unsigned)
I,[name], CMA,acting as a Neurosurgeon for [employer], NP.,have documented all relevant documentation on the behalf of Ardice Boyan, NP,as directed by  Oscar Hank Moshe Salisbury, NP while in the presence of Rian Koon, NP.  Subjective:  Patient ID: Michelle Mcneil , female    DOB: 10/23/1965 , 57 y.o.   MRN: [id number]  Chief Complaint  Patient presents with   Establish Care   Weight Loss    HPI  Patient presents today to establish care. She reports it has been a couple years since she has seen a primary care provider.  She reported she does go to Dr.Cousins for her GYN care. Patient is interesting in discussing weight loss management.She takes testerone pillet for menopausal symptoms like hot-flashes etc.She is interested in using the once weekly injections such as [facility] for weigh loss.  Patient fell in a grocery store and broke her right shoulder some weeks ago, she had surgery and has a sling on right now.     Past Medical History:  Diagnosis Date   Adenomatous colon polyp    Anemia    Atypical chest pain 01/06/2016   saw cardiologist - EKG normal-brady, ETT Normal   Bradycardia 01/06/2016   GERD (gastroesophageal reflux disease)    no meds, diet controlled   Pneumonia    as a child   SVD (spontaneous vaginal delivery)    x 2     Family History  Problem Relation Age of Onset   Colon polyps Mother    Alzheimer's disease Mother    Heart attack Mother    Diabetes Father    Hypertension Father    Dementia Father    Breast cancer Sister    Cancer Son        osteosarcoma   Colon cancer Maternal Aunt    Cancer Maternal Aunt        colon     Current Outpatient Medications:    Semaglutide-Weight Management 0.25 MG/0.5ML SOAJ, Inject 0.25 mg into the skin once a week for 28 days., Disp: 2 mL, Rfl: 0   [START ON 12/28/2022] Semaglutide-Weight Management 0.5 MG/0.5ML SOAJ, Inject 0.5 mg into the skin once a week for 28 days., Disp: 2 mL, Rfl: 0   [START ON 01/26/2023]  Semaglutide-Weight Management 1 MG/0.5ML SOAJ, Inject 1 mg into the skin once a week for 28 days., Disp: 2 mL, Rfl: 0   [START ON 02/24/2023] Semaglutide-Weight Management 1.7 MG/0.75ML SOAJ, Inject 1.7 mg into the skin once a week for 28 days., Disp: 3 mL, Rfl: 0   [START ON 03/25/2023] Semaglutide-Weight Management 2.4 MG/0.75ML SOAJ, Inject 2.4 mg into the skin once a week for 28 days., Disp: 3 mL, Rfl: 0   acetaminophen (TYLENOL) 500 MG tablet, Take 500 mg by mouth every 6 (six) hours as needed (for pain.)., Disp: , Rfl:    b complex vitamins capsule, Take 1 capsule by mouth daily., Disp: , Rfl:    Cholecalciferol (VITAMIN D) 50 MCG (2000 UT) CAPS, Take by mouth., Disp: , Rfl:    HYDROcodone-acetaminophen (NORCO/VICODIN) 5-325 MG tablet, Take 1-2 tablets by mouth every 4 (four) hours as needed., Disp: 10 tablet, Rfl: 0   ibuprofen (ADVIL,MOTRIN) 200 MG tablet, Take 400 mg by mouth every 8 (eight) hours as needed (for pain.). , Disp: , Rfl:    No Known Allergies   Review of Systems  Constitutional: Negative.   Eyes: Negative.   Respiratory: Negative.    Cardiovascular: Negative.   Gastrointestinal: Negative.  Negative for abdominal distention and abdominal pain.  Genitourinary:  Negative for dyspareunia.  Psychiatric/Behavioral: Negative.       Today's Vitals   11/29/22 1454  BP: 122/82  Pulse: 91  Temp: 98.4 F (36.9 C)  Weight: 205 lb 6.4 oz (93.2 kg)  Height: 5' 2.8" (1.595 m)  PainSc: 0-No pain   Body mass index is 36.62 kg/m.  Wt Readings from Last 3 Encounters:  11/29/22 205 lb 6.4 oz (93.2 kg)  10/13/22 195 lb (88.5 kg)  06/13/20 189 lb (85.7 kg)     Objective:  Physical Exam Pulmonary:     Effort: Pulmonary effort is normal.     Breath sounds: Normal breath sounds.  Musculoskeletal:     Left shoulder: Tenderness present. Decreased range of motion.  Neurological:     General: No focal deficit present.     Mental Status: She is alert and oriented to person,  place, and time.         Assessment And Plan:  Class 2 obesity due to excess calories with body mass index (BMI) of 36.0 to 36.9 in adult, unspecified whether serious comorbidity present Assessment & Plan: She is encouraged to strive for BMI less than 30 to decrease cardiac risk. Advised to aim for at least 150 minutes of exercise per week.   Orders: -     Semaglutide-Weight Management; Inject 0.25 mg into the skin once a week for 28 days.  Dispense: 2 mL; Refill: 0 -     Semaglutide-Weight Management; Inject 0.5 mg into the skin once a week for 28 days.  Dispense: 2 mL; Refill: 0 -     Semaglutide-Weight Management; Inject 1 mg into the skin once a week for 28 days.  Dispense: 2 mL; Refill: 0 -     Semaglutide-Weight Management; Inject 1.7 mg into the skin once a week for 28 days.  Dispense: 3 mL; Refill: 0 -     Semaglutide-Weight Management; Inject 2.4 mg into the skin once a week for 28 days.  Dispense: 3 mL; Refill: 0 -     BMP8+eGFR -     CBC -     Hemoglobin A1c  Establishing care with new doctor, encounter for  Need for shingles vaccine -     Varicella-zoster vaccine IM  Herpes zoster vaccination declined     Return in about 2 months (around 01/29/2023) for weight check, physical.  Patient was given opportunity to ask questions. Patient verbalized understanding of the plan and was able to repeat key elements of the plan. All questions were answered to their satisfaction.  Hanzel Pizzo Moshe Salisbury, NP  I, Collins Kerby Moshe Salisbury, NP, have reviewed all documentation for this visit. The documentation on 12/12/22 for the exam, diagnosis, procedures, and orders are all accurate and complete.   IF YOU HAVE BEEN REFERRED TO A SPECIALIST, IT MAY TAKE 1-2 WEEKS TO SCHEDULE/PROCESS THE REFERRAL. IF YOU HAVE NOT HEARD FROM US/SPECIALIST IN TWO WEEKS, PLEASE GIVE [place] A CALL AT [phone].   THE PATIENT IS ENCOURAGED TO PRACTICE SOCIAL DISTANCING DUE TO THE COVID-19 PANDEMIC.

## 2022-12-12 DIAGNOSIS — Z23 Encounter for immunization: Secondary | ICD-10-CM | POA: Insufficient documentation

## 2022-12-12 DIAGNOSIS — Z7689 Persons encountering health services in other specified circumstances: Secondary | ICD-10-CM | POA: Insufficient documentation

## 2022-12-12 DIAGNOSIS — Z2821 Immunization not carried out because of patient refusal: Secondary | ICD-10-CM | POA: Insufficient documentation

## 2022-12-12 NOTE — Assessment & Plan Note (Signed)
She is encouraged to strive for BMI less than 30 to decrease cardiac risk. Advised to aim for at least 150 minutes of exercise per week.  

## 2023-01-30 NOTE — Patient Instructions (Signed)

## 2023-01-30 NOTE — Progress Notes (Unsigned)
I,Michelle Mcneil, CMA,acting as a Neurosurgeon for Michelle Lynch, Mcneil.,have documented all relevant documentation on the behalf of Michelle Mcneil,as directed by  Michelle Mcneil while in the presence of Michelle Mcneil.  Subjective:    Patient ID: Michelle Mcneil , female    DOB: Nov 09, 1965 , 57 y.o.   MRN: [id number]  No chief complaint on file.   HPI  Patient presents today for annual exam. Patient states that at her Gyn appointment, her BP was elevated and she was told to see her PCP. Today BP in the office is 140/90, will recheck before she leaves. Patient states she does not check BP at home. Denies chest pain, dizziness , lightheadedness. Patient's GYN is Michelle Mcneil.Patient states that she had her Colonoscopy with Michelle Mcneil early 2024. Patient states she has tried to lose weight by diet and exercise for over 6 months but it has not yielded much result, she would like some help with medication. Patient refused to have blood drawn because she states she had labs on 8/1, though she was  made to understand that some important labs were not drawn on 8/1. Letter has been sent to Michelle Mcneil' s office for pap result.      Past Medical History:  Diagnosis Date   Adenomatous colon polyp    Anemia    Atypical chest pain 01/06/2016   saw cardiologist - EKG normal-brady, ETT Normal   Bradycardia 01/06/2016   GERD (gastroesophageal reflux disease)    no meds, diet controlled   Pneumonia    as a child   SVD (spontaneous vaginal delivery)    x 2     Family History  Problem Relation Age of Onset   Colon polyps Mother    Alzheimer's disease Mother    Heart attack Mother    Diabetes Father    Hypertension Father    Dementia Father    Breast cancer Sister    Cancer Son        osteosarcoma   Colon cancer Maternal Aunt    Cancer Maternal Aunt        colon     Current Outpatient Medications:    acetaminophen (TYLENOL) 500 MG tablet, Take 500 mg by mouth every 6 (six) hours as needed (for  pain.)., Disp: , Rfl:    b complex vitamins capsule, Take 1 capsule by mouth daily., Disp: , Rfl:    Cholecalciferol (VITAMIN D) 50 MCG (2000 UT) CAPS, Take by mouth., Disp: , Rfl:    ibuprofen (ADVIL,MOTRIN) 200 MG tablet, Take 400 mg by mouth every 8 (eight) hours as needed (for pain.). , Disp: , Rfl:    Semaglutide-Weight Management (WEGOVY) 0.25 MG/0.5ML SOAJ, Inject 0.25 mg into the skin every 7 (seven) days., Disp: 2 mL, Rfl: 1   HYDROcodone-acetaminophen (NORCO/VICODIN) 5-325 MG tablet, Take 1-2 tablets by mouth every 4 (four) hours as needed. (Patient not taking: Reported on 01/31/2023), Disp: 10 tablet, Rfl: 0   No Known Allergies     Social History   Tobacco Use  Smoking Status Never  Smokeless Tobacco Never    Review of Systems  Constitutional: Negative.   HENT: Negative.    Eyes: Negative.   Respiratory: Negative.    Cardiovascular: Negative.   Gastrointestinal: Negative.   Endocrine: Negative.   Genitourinary: Negative.   Musculoskeletal: Negative.   Skin: Negative.   Allergic/Immunologic: Negative.   Neurological: Negative.  Negative for light-headedness and headaches.  Hematological: Negative.   Psychiatric/Behavioral: Negative.  Today's Vitals   01/31/23 0842  BP: (!) 140/90  Pulse: 83  Temp: 97.7 F (36.5 C)  SpO2: 98%  Weight: 205 lb 12.8 oz (93.4 kg)  Height: 5\' 2"  (1.575 m)   Body mass index is 37.64 kg/m.  Wt Readings from Last 3 Encounters:  01/31/23 205 lb 12.8 oz (93.4 kg)  11/29/22 205 lb 6.4 oz (93.2 kg)  10/13/22 195 lb (88.5 kg)     Objective:  Physical Exam HENT:     Head: Normocephalic.  Cardiovascular:     Rate and Rhythm: Normal rate and regular rhythm.  Abdominal:     General: Bowel sounds are normal.  Musculoskeletal:        General: Normal range of motion.  Neurological:     General: No focal deficit present.     Mental Status: She is alert and oriented to person, place, and time.         Assessment And Plan:      Encounter for annual health examination  Elevated BP without diagnosis of hypertension Assessment & Plan: Low salt diet and exercise advised. Nurse visit in 1 week for reevaluation.   Class 2 obesity due to excess calories without serious comorbidity with body mass index (BMI) of 37.0 to 37.9 in adult Assessment & Plan: She is encouraged to strive for BMI less than 30 to decrease cardiac risk. Advised to aim for at least 150 minutes of exercise per week. Has tried diet and exercise for over 6 months with no results willing to try weight loss medication.  Orders: -     Wegovy; Inject 0.25 mg into the skin every 7 (seven) days.  Dispense: 2 mL; Refill: 1     Return for 1 year HM , return in 1 week for nurse visit for BP check. Patient was given opportunity to ask questions. Patient verbalized understanding of the plan and was able to repeat key elements of the plan. All questions were answered to their satisfaction.    I, Michelle Mcneil, have reviewed all documentation for this visit. The documentation on 01/31/23 for the exam, diagnosis, procedures, and orders are all accurate and complete.    Michelle Mcneil

## 2023-01-31 ENCOUNTER — Ambulatory Visit (INDEPENDENT_AMBULATORY_CARE_PROVIDER_SITE_OTHER): Payer: 59 | Admitting: Family Medicine

## 2023-01-31 ENCOUNTER — Encounter: Payer: Self-pay | Admitting: Family Medicine

## 2023-01-31 VITALS — BP 140/90 | HR 83 | Temp 97.7°F | Ht 62.0 in | Wt 205.8 lb

## 2023-01-31 DIAGNOSIS — E6609 Other obesity due to excess calories: Secondary | ICD-10-CM

## 2023-01-31 DIAGNOSIS — Z0000 Encounter for general adult medical examination without abnormal findings: Secondary | ICD-10-CM

## 2023-01-31 DIAGNOSIS — R030 Elevated blood-pressure reading, without diagnosis of hypertension: Secondary | ICD-10-CM | POA: Insufficient documentation

## 2023-01-31 DIAGNOSIS — E66812 Obesity, class 2: Secondary | ICD-10-CM

## 2023-01-31 DIAGNOSIS — Z6837 Body mass index (BMI) 37.0-37.9, adult: Secondary | ICD-10-CM

## 2023-01-31 MED ORDER — WEGOVY 0.25 MG/0.5ML ~~LOC~~ SOAJ: 0.2500 mg | SUBCUTANEOUS | 1 refills | Status: DC

## 2023-01-31 NOTE — Assessment & Plan Note (Signed)
She is encouraged to strive for BMI less than 30 to decrease cardiac risk. Advised to aim for at least 150 minutes of exercise per week. Has tried diet and exercise for over 6 months with no results willing to try weight loss medication.

## 2023-01-31 NOTE — Assessment & Plan Note (Signed)
Low salt diet and exercise advised. Nurse visit in 1 week for reevaluation.

## 2023-02-08 ENCOUNTER — Ambulatory Visit: Payer: 59

## 2023-02-08 VITALS — BP 120/80 | HR 82 | Temp 98.5°F | Ht 62.0 in | Wt 205.0 lb

## 2023-02-08 DIAGNOSIS — R030 Elevated blood-pressure reading, without diagnosis of hypertension: Secondary | ICD-10-CM

## 2023-02-08 NOTE — Progress Notes (Signed)
Patient presents today for a BP check, patient currently not on any medications for hypertension.  BP Readings from Last 3 Encounters:  02/08/23 120/80  01/31/23 (!) 140/90  11/29/22 122/82   Per provider- check blood pressure at home, if elevated please call office.

## 2023-02-18 ENCOUNTER — Institutional Professional Consult (permissible substitution): Payer: 59 | Admitting: Neurology

## 2023-02-19 ENCOUNTER — Institutional Professional Consult (permissible substitution): Payer: 59 | Admitting: Neurology

## 2023-02-26 ENCOUNTER — Ambulatory Visit (INDEPENDENT_AMBULATORY_CARE_PROVIDER_SITE_OTHER): Payer: 59 | Admitting: Neurology

## 2023-02-26 ENCOUNTER — Encounter: Payer: Self-pay | Admitting: Neurology

## 2023-02-26 VITALS — BP 133/92 | HR 70 | Ht 61.0 in | Wt 206.0 lb

## 2023-02-26 DIAGNOSIS — R0683 Snoring: Secondary | ICD-10-CM | POA: Diagnosis not present

## 2023-02-26 DIAGNOSIS — R351 Nocturia: Secondary | ICD-10-CM

## 2023-02-26 DIAGNOSIS — R0681 Apnea, not elsewhere classified: Secondary | ICD-10-CM | POA: Diagnosis not present

## 2023-02-26 DIAGNOSIS — Z9189 Other specified personal risk factors, not elsewhere classified: Secondary | ICD-10-CM | POA: Diagnosis not present

## 2023-02-26 DIAGNOSIS — G4719 Other hypersomnia: Secondary | ICD-10-CM | POA: Diagnosis not present

## 2023-02-26 DIAGNOSIS — E669 Obesity, unspecified: Secondary | ICD-10-CM

## 2023-02-26 NOTE — Progress Notes (Signed)
Subjective:    Patient ID: Michelle Mcneil is a 57 y.o. female.  HPI    Huston Foley, MD, PhD [facility] [address] [address] [place], [place] [address]  Dear Dr. Dossie Der,   I saw your patient, Michelle Mcneil, upon your kind request in my sleep clinic today for initial consultation of her sleep disorder combined with concern for underlying obstructive sleep apnea.  The patient is unaccompanied today.  As you know, [name] is a 57 year old female with an underlying medical history of anemia, atypical chest pain, bradycardia, reflux disease, migraine headaches, and obesity, who reports snoring and excessive daytime somnolence, as well as breathing pauses while asleep.  I reviewed your office note from 01/04/2023.  She is related bariatric surgery, particularly sleeve gastrectomy.  Her Epworth sleepiness score is 14 out of 24, fatigue severity score is 28 out of 63.  He reports snoring as well as witnessed apneas per husband's feedback, she suspects that her dad may have had undiagnosed sleep apnea.  She tries to make time for sleep, generally in bed around 930 but may not be asleep consistently until a couple of hours later.  She has nocturia about once per average night, typically in the early morning hours.  Her rise time is 6:30 AM.  She works as an Charity fundraiser for the [employer]  She denies recurrent nocturnal or morning headaches.  She does have a history of migraines, takes Nurtec as needed.  She has had weight gain since her menopause.  She estimates that she gained about 20 pounds since then.  She does fall asleep with the TV on at night at times, she is trying to cut out the TV watching before bedtime.  She drinks caffeine in the form of coffee, 1 12 ounce serving in the morning, and a small soda, 8 ounce during the day.  She drinks alcohol rarely, less than once a month.  She is a non-smoker.  She lives with her husband.    Her Past Medical History Is  Significant For: Past Medical History:  Diagnosis Date   Adenomatous colon polyp    Anemia    Atypical chest pain 01/06/2016   saw cardiologist - EKG normal-brady, ETT Normal   Bradycardia 01/06/2016   GERD (gastroesophageal reflux disease)    no meds, diet controlled   Pneumonia    as a child   SVD (spontaneous vaginal delivery)    x 2    Her Past Surgical History Is Significant For: Past Surgical History:  Procedure Laterality Date   BREAST EXCISIONAL BIOPSY Right 06/03/2013   BREAST LUMPECTOMY WITH NEEDLE LOCALIZATION Right 06/03/2013   Procedure: BREAST LUMPECTOMY WITH NEEDLE LOCALIZATION;  Surgeon: Maisie Fus A. Cornett, MD;  Location: [facility] OR;  Service: General;  Laterality: Right;   BREAST SURGERY Right    lumpectomy   cerclage stitch     COLONOSCOPY  01/21/2012   Procedure: COLONOSCOPY;  Surgeon: Hart Carwin, MD;  Location: [facility] END[facility]COPY;  Service: Endoscopy;  Laterality: N/A;   DILATATION & CURETTAGE/HYSTEROSCOPY WITH MYOSURE N/A 03/10/2018   Procedure: DILATATION & CURETTAGE/HYSTEROSCOPY WITH MYOSURE;  Surgeon: Maxie Better, MD;  Location: [facility] ORS;  Service: Gynecology;  Laterality: N/A;   EYE SURGERY Bilateral    Lasik   HAND SURGERY Right    trigger finer - thumb   ORIF ACETABULAR FRACTURE     RIGHT SHOULDER SURGERY  Left    09/2022   TUBAL LIGATION  UPPER GI ENDOSCOPY     WISDOM TOOTH EXTRACTION  1993    Her Family History Is Significant For: Family History  Problem Relation Age of Onset   Colon polyps Mother    Alzheimer's disease Mother    Heart attack Mother    Diabetes Father    Hypertension Father    Dementia Father    Breast cancer Sister    Colon cancer Maternal Aunt    Cancer Maternal Aunt        colon   Cancer Son        osteosarcoma   Sleep apnea Neg Hx     Her Social History Is Significant For: Social History   Socioeconomic History   Marital status: Married    Spouse name: Not on file   Number of children: Not on file    Years of education: Not on file   Highest education level: Not on file  Occupational History   Not on file  Tobacco Use   Smoking status: Never   Smokeless tobacco: Never  Vaping Use   Vaping status: Never Used  Substance and Sexual Activity   Alcohol use: No   Drug use: No   Sexual activity: Yes  Other Topics Concern   Not on file  Social History Narrative   Not on file   Social Determinants of Health   Financial Resource Strain: Not on file  Food Insecurity: Not on file  Transportation Needs: Not on file  Physical Activity: Not on file  Stress: Not on file  Social Connections: Not on file    Her Allergies Are:  No Known Allergies:   Her Current Medications Are:  Outpatient Encounter Medications as of 02/26/2023  Medication Sig   b complex vitamins capsule Take 1 capsule by mouth daily.   Cholecalciferol (VITAMIN D) 50 MCG (2000 UT) CAPS Take by mouth.   NURTEC 75 MG TBDP Take 1 tablet by mouth as needed.   acetaminophen (TYLENOL) 500 MG tablet Take 500 mg by mouth every 6 (six) hours as needed (for pain.).   HYDROcodone-acetaminophen (NORCO/VICODIN) 5-325 MG tablet Take 1-2 tablets by mouth every 4 (four) hours as needed.   ibuprofen (ADVIL,MOTRIN) 200 MG tablet Take 400 mg by mouth every 8 (eight) hours as needed (for pain.).   Semaglutide-Weight Management (WEGOVY) 0.25 MG/0.5ML SOAJ Inject 0.25 mg into the skin every 7 (seven) days.   No facility-administered encounter medications on file as of 02/26/2023.  :   Review of Systems:  Out of a complete 14 point review of systems, all are reviewed and negative with the exception of these symptoms as listed below:  Review of Systems  Neurological:        Pt here for sleep consult Pt snores,fatigue,headaches Pt denies hypertension,sleep study,cpap machine    ESS: 14 FSS:28    Objective:  Neurological Exam  Physical Exam Physical Examination:   Vitals:   02/26/23 0839  BP: (!) 133/92  Pulse: 70     General Examination: The patient is a very pleasant 57 y.o. female in no acute distress. She appears well-developed and well-nourished and well groomed.   HEENT: Normocephalic, atraumatic, pupils are equal, round and reactive to light, extraocular tracking is good without limitation to gaze excursion or nystagmus noted. Hearing is grossly intact. Face is symmetric with normal facial animation. Speech is clear with no dysarthria noted. There is no hypophonia. There is no lip, neck/head, jaw or voice tremor. Neck is supple with full range of  passive and active motion. There are no carotid bruits on auscultation. Oropharynx exam reveals: No significant mouth dryness, good dental hygiene, moderate airway crowding secondary to small airway entry, Mallampati class III, tonsillar size of about 1+, left side easier to see compared to right, uvula slightly wider but not elongated, slightly wider tongue noted.  Tongue protrudes centrally and palate elevates symmetrically.  Neck circumference 15-3/8 inches.  Mild overbite noted.   Chest: Clear to auscultation without wheezing, rhonchi or crackles noted.  Heart: S1+S2+0, regular and normal without murmurs, rubs or gallops noted.   Abdomen: Soft, non-tender and non-distended.  Extremities: There is no pitting edema in the distal lower extremities bilaterally.   Skin: Warm and dry without trophic changes noted.   Musculoskeletal: exam reveals well-healed scar left shoulder from surgery in June 2024.    Neurologically:  Mental status: The patient is awake, alert and oriented in all 4 spheres. Her immediate and remote memory, attention, language skills and fund of knowledge are appropriate. There is no evidence of aphasia, agnosia, apraxia or anomia. Speech is clear with normal prosody and enunciation. Thought process is linear. Mood is normal and affect is normal.  Cranial nerves II - XII are as described above under HEENT exam.  Motor exam: Normal bulk,  strength and tone is noted. There is no obvious action or resting tremor.  Fine motor skills and coordination: grossly intact.  Cerebellar testing: No dysmetria or intention tremor. There is no truncal or gait ataxia.  Sensory exam: intact to light touch in the upper and lower extremities.  Gait, station and balance: She stands easily. No veering to one side is noted. No leaning to one side is noted. Posture is age-appropriate and stance is narrow based. Gait shows normal stride length and normal pace. No problems turning are noted.   Assessment and Plan:  In summary, ANSLEY CULLARS is a very pleasant 57 y.o.-year old female with an underlying medical history of anemia, atypical chest pain, bradycardia, reflux disease, migraine headaches, and obesity, whose history and physical exam are concerning for sleep disordered breathing, particularly obstructive sleep apnea (OSA).  While a laboratory attended sleep study is typically considered "gold standard" for evaluation of sleep disordered breathing, we mutually agreed to proceed with a home sleep test at this time.   I had a long chat with the patient about my findings and the diagnosis of sleep apnea, particularly OSA, its prognosis and treatment options. We talked about medical/conservative treatments, surgical interventions and non-pharmacological approaches for symptom control. I explained, in particular, the risks and ramifications of untreated moderate to severe OSA, especially with respect to developing cardiovascular disease down the road, including congestive heart failure (CHF), difficult to treat hypertension, cardiac arrhythmias (particularly A-fib), neurovascular complications including TIA, stroke and dementia. Even type 2 diabetes has, in part, been linked to untreated OSA. Symptoms of untreated OSA may include (but may not be limited to) daytime sleepiness, nocturia (i.e. frequent nighttime urination), memory problems, mood irritability and  suboptimally controlled or worsening mood disorder such as depression and/or anxiety, lack of energy, lack of motivation, physical discomfort, as well as recurrent headaches, especially morning or nocturnal headaches. We talked about the importance of maintaining a healthy lifestyle and striving for healthy weight. In addition, we talked about the importance of striving for and maintaining good sleep hygiene. I recommended a sleep study at this time. I outlined the differences between a laboratory attended sleep study which is considered more comprehensive and accurate over  the option of a home sleep test (HST); the latter may lead to underestimation of sleep disordered breathing in some instances and does not help with diagnosing upper airway resistance syndrome and is not accurate enough to diagnose primary central sleep apnea typically. I outlined possible surgical and non-surgical treatment options of OSA, including the use of a positive airway pressure (PAP) device (i.e. CPAP, AutoPAP/APAP or BiPAP in certain circumstances), a custom-made dental device (aka oral appliance, which would require a referral to a specialist dentist or orthodontist typically, and is generally speaking not considered for patients with full dentures or edentulous state), upper airway surgical options, such as traditional UPPP (which is not considered a first-line treatment) or the Inspire device (hypoglossal nerve stimulator, which would involve a referral for consultation with an ENT surgeon, after careful selection, following inclusion criteria - also not first-line treatment). I explained the PAP treatment option to the patient in detail, as this is generally considered first-line treatment.  The patient indicated that she would be willing to try PAP therapy, if the need arises. I explained the importance of being compliant with PAP treatment, not only for insurance purposes but primarily to improve patient's symptoms symptoms,  and for the patient's long term health benefit, including to reduce Her cardiovascular risks longer-term.    We will pick up our discussion about the next steps and treatment options after testing.  We will keep her posted as to the test results by phone call and/or MyChart messaging where possible.  We will plan to follow-up in sleep clinic accordingly as well.  I answered all her questions today and the patient was in agreement.   I encouraged her to call with any interim questions, concerns, problems or updates or email [place] through MyChart.  Generally speaking, sleep test authorizations may take up to 2 weeks, sometimes less, sometimes longer, the patient is encouraged to get in touch with [place] if they do not hear back from the sleep lab staff directly within the next 2 weeks.  Thank you very much for allowing me to participate in the care of this nice patient. If I can be of any further assistance to you please do not hesitate to call me at [phone].  Sincerely,   Huston Foley, MD, PhD

## 2023-02-26 NOTE — Patient Instructions (Signed)

## 2023-03-18 IMAGING — MG MM DIGITAL SCREENING BILAT W/ TOMO AND CAD
8 series · 8 of 24 positions shown · non-contrast
Comparison: Previous exam(s).

CLINICAL DATA: Screening.

EXAM:
DIGITAL SCREENING BILATERAL MAMMOGRAM WITH TOMOSYNTHESIS AND CAD
TECHNIQUE: Bilateral screening digital craniocaudal and mediolateral oblique
mammograms were obtained. Bilateral screening digital breast
tomosynthesis was performed. The images were evaluated with
computer-aided detection.

[L MLO synth-2D]
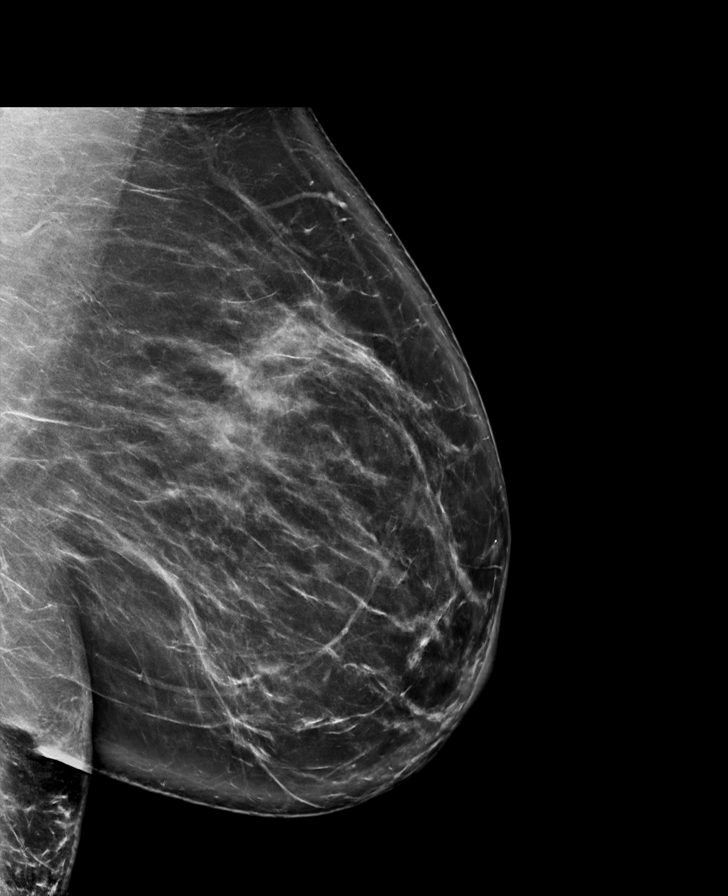

[R MLO synth-2D]
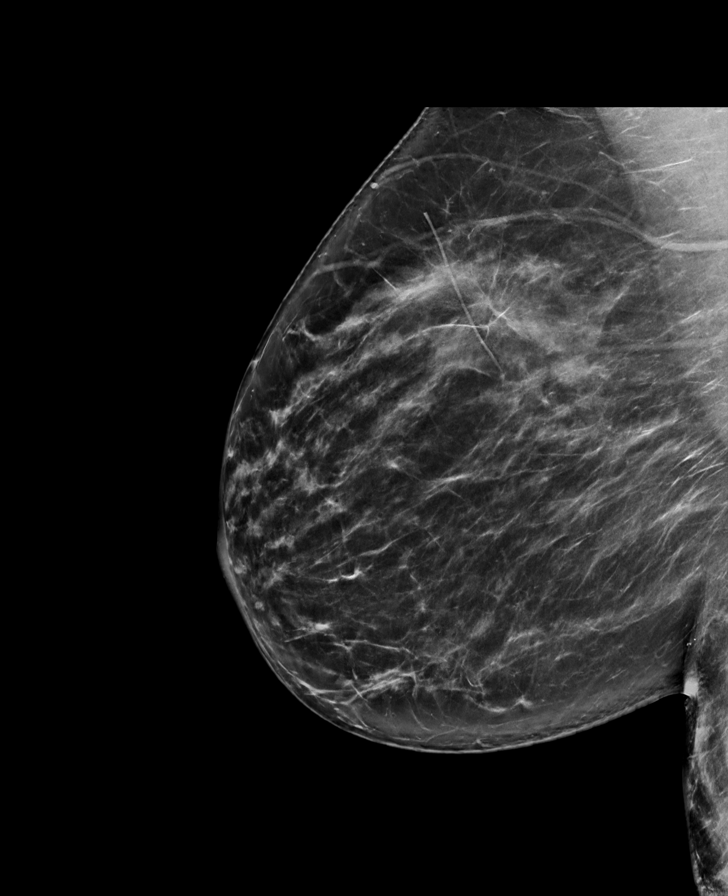

[L CC synth-2D]
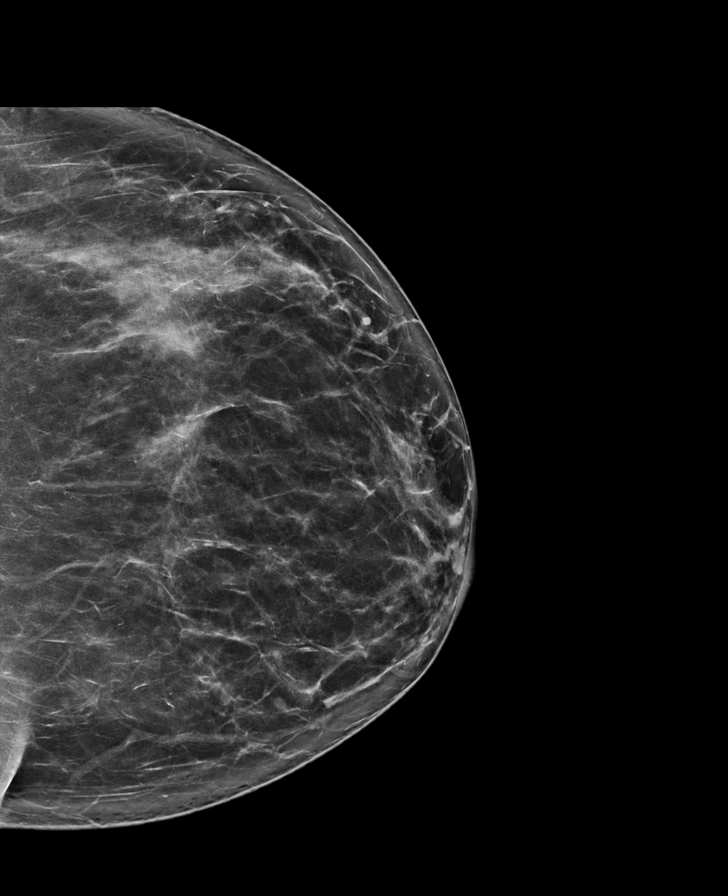

[R CC synth-2D]
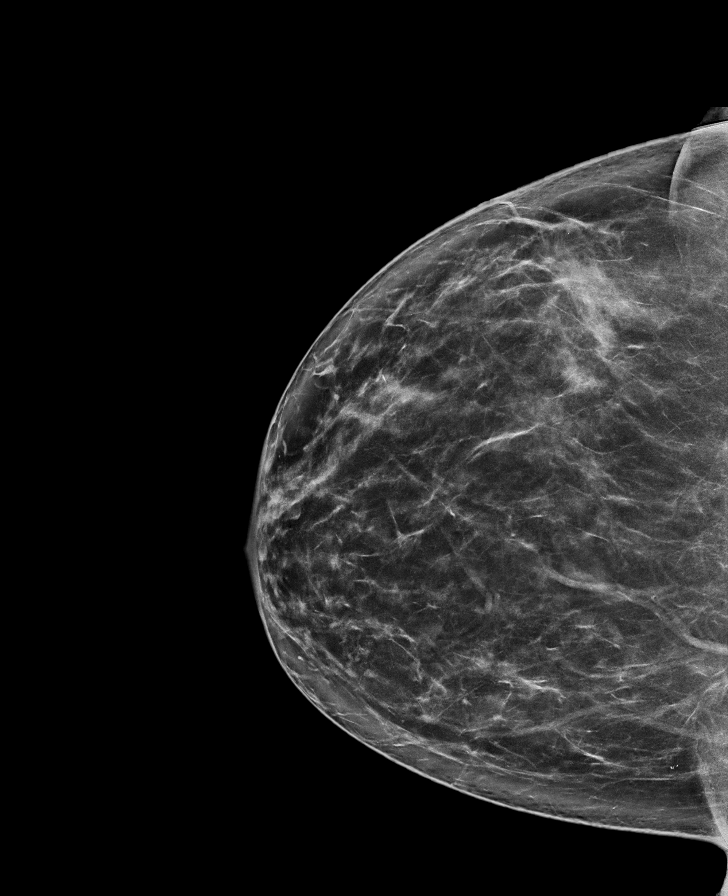

[L CC tomo · tomo slice 43/86.0]
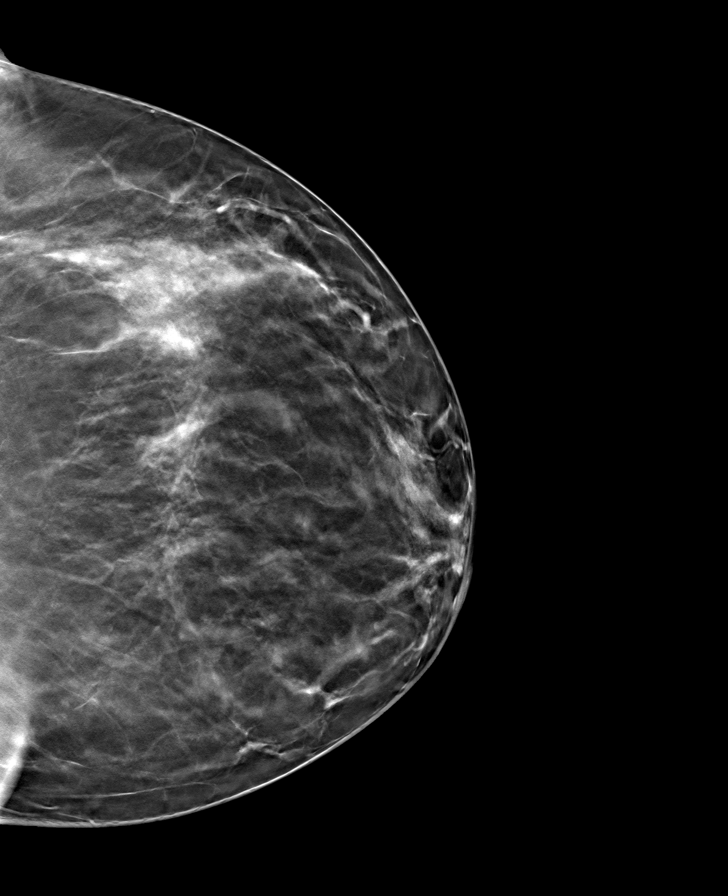

[R MLO tomo · tomo slice 47/92.0]
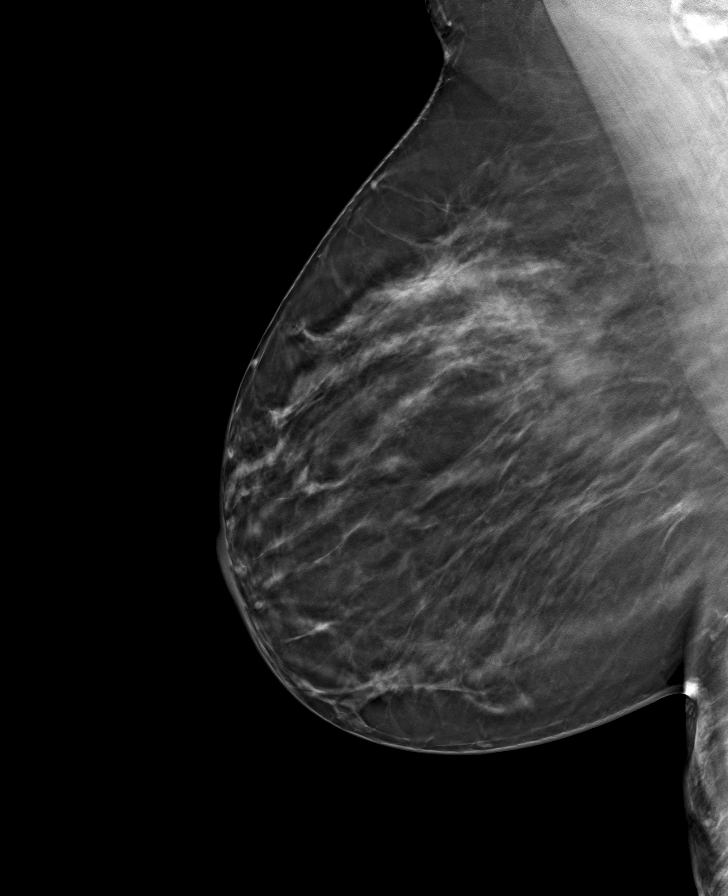

[R CC tomo · tomo slice 47/92.0]
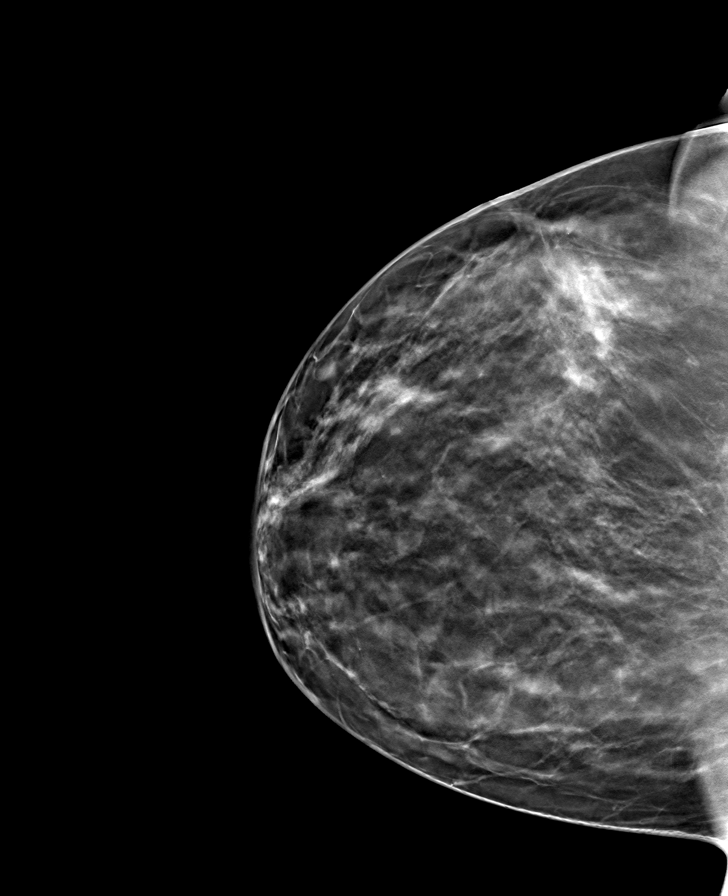

[L MLO tomo · tomo slice 51/100.0]
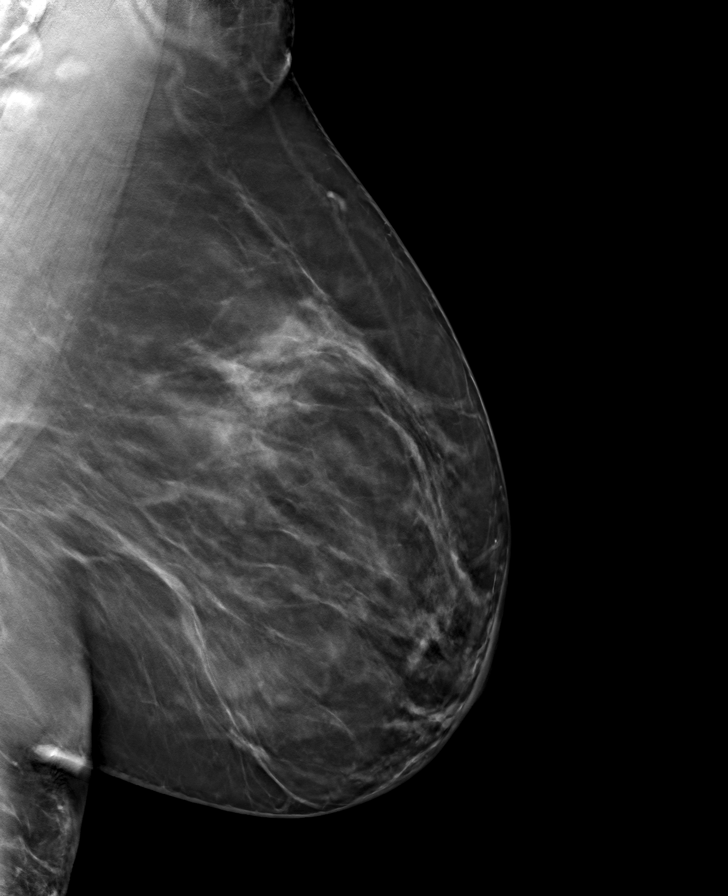

[8 of 24 positions shown; findings below may reference images not displayed]

ACR Breast Density Category c: The breast tissue is heterogeneously
dense, which may obscure small masses.
FINDINGS: There are no findings suspicious for malignancy.
IMPRESSION: No mammographic evidence of malignancy. A result letter of this
screening mammogram will be mailed directly to the patient.

RECOMMENDATION:
Screening mammogram in one year. (Code:Q3-W-BC3)

BI-RADS CATEGORY  1: Negative.

## 2023-04-10 LAB — HM PAP SMEAR: HPV, high-risk: NEGATIVE

## 2023-06-07 IMAGING — US US AXILLARY LEFT
1 series · 4 of 4 positions shown · non-contrast
Comparison: No previous axillary ultrasound.

CLINICAL DATA: Patient describes a recent palpable lump within the
LEFT axilla, now resolved.

EXAM:
ULTRASOUND OF THE LEFT AXILLA

[Series 1: us axillary left · 0.07mm/px · 4 of 4 slices shown]
[im 1/4]
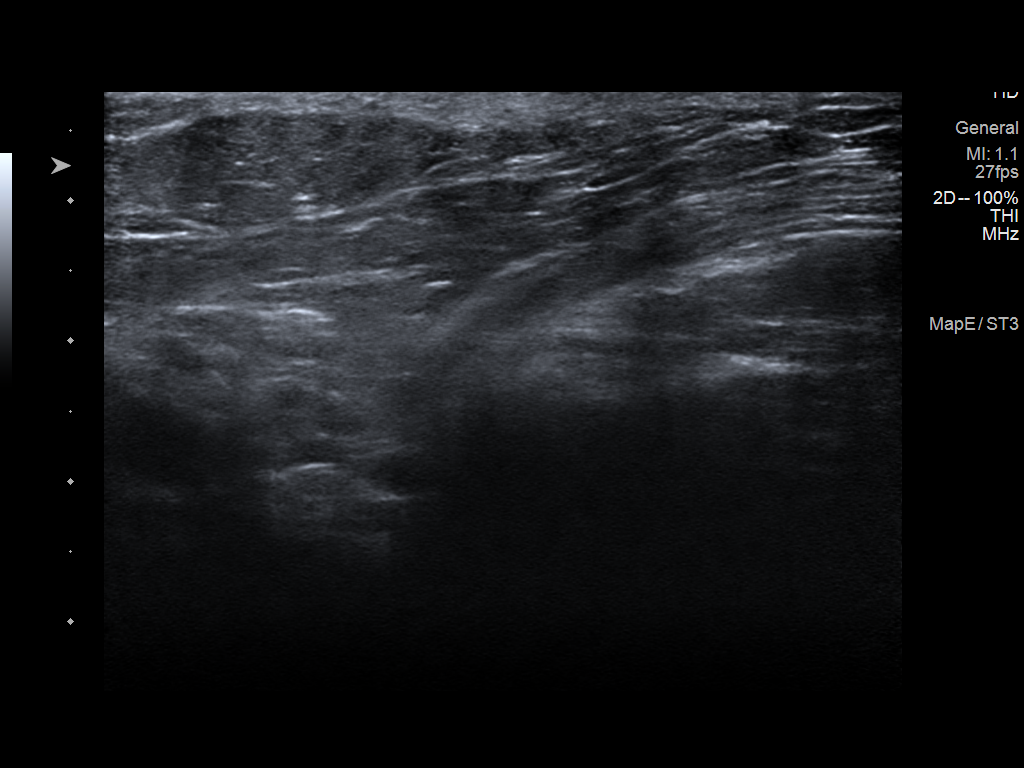
[im 2/4]
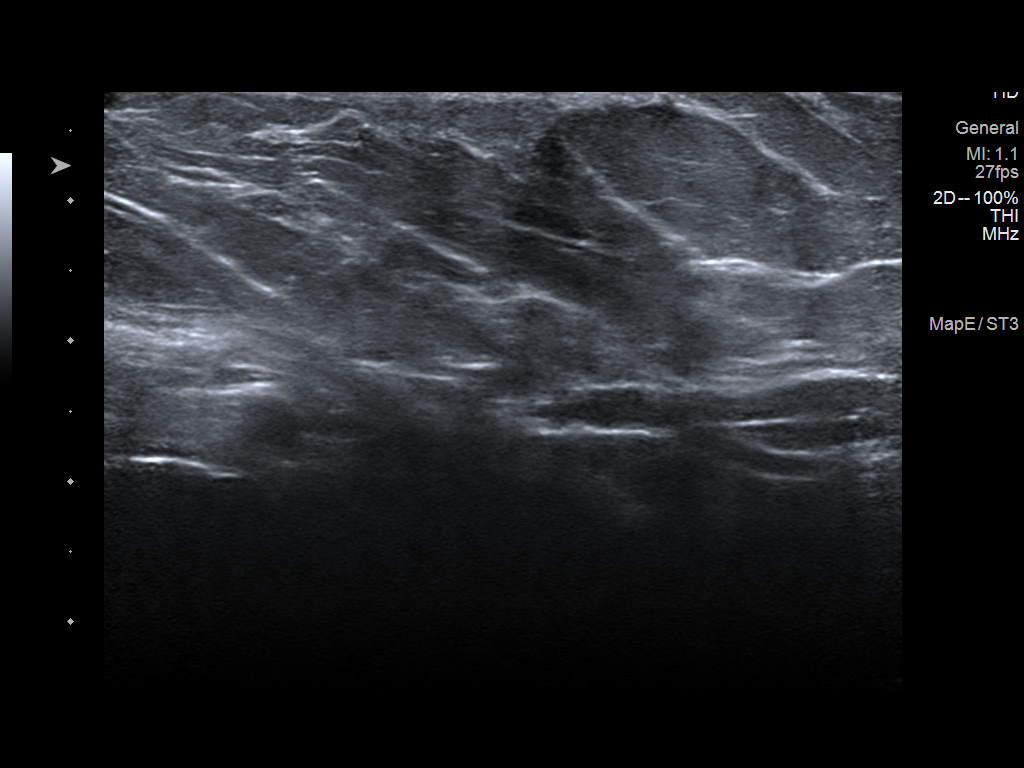
[im 3/4]
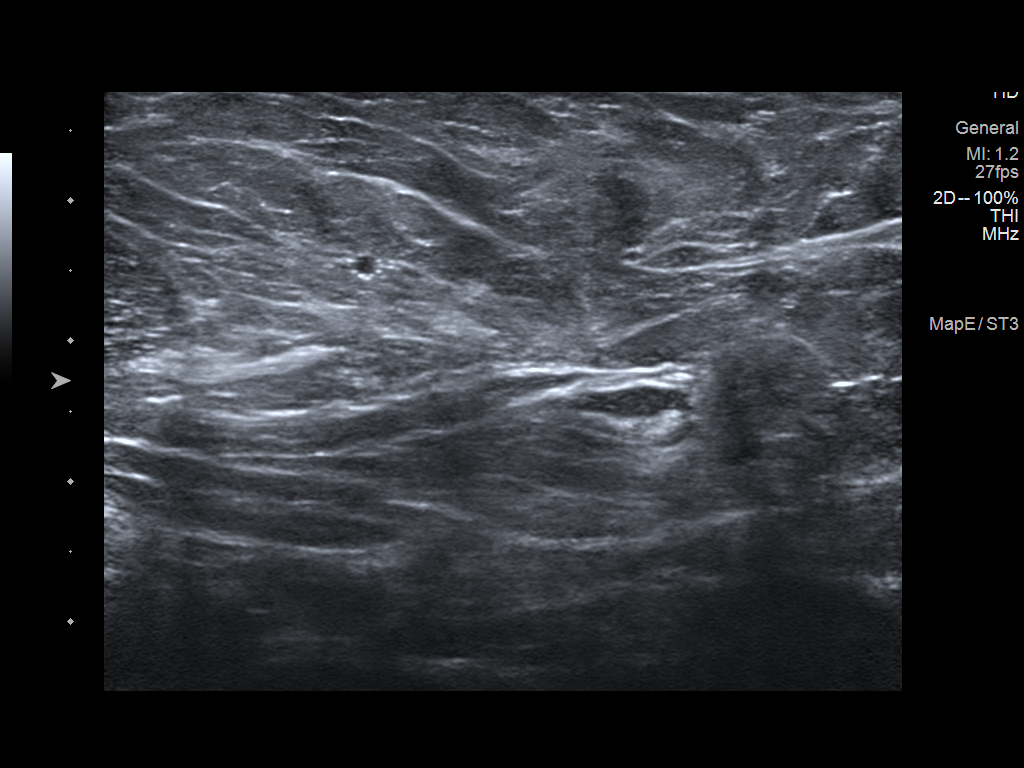
[im 4/4]
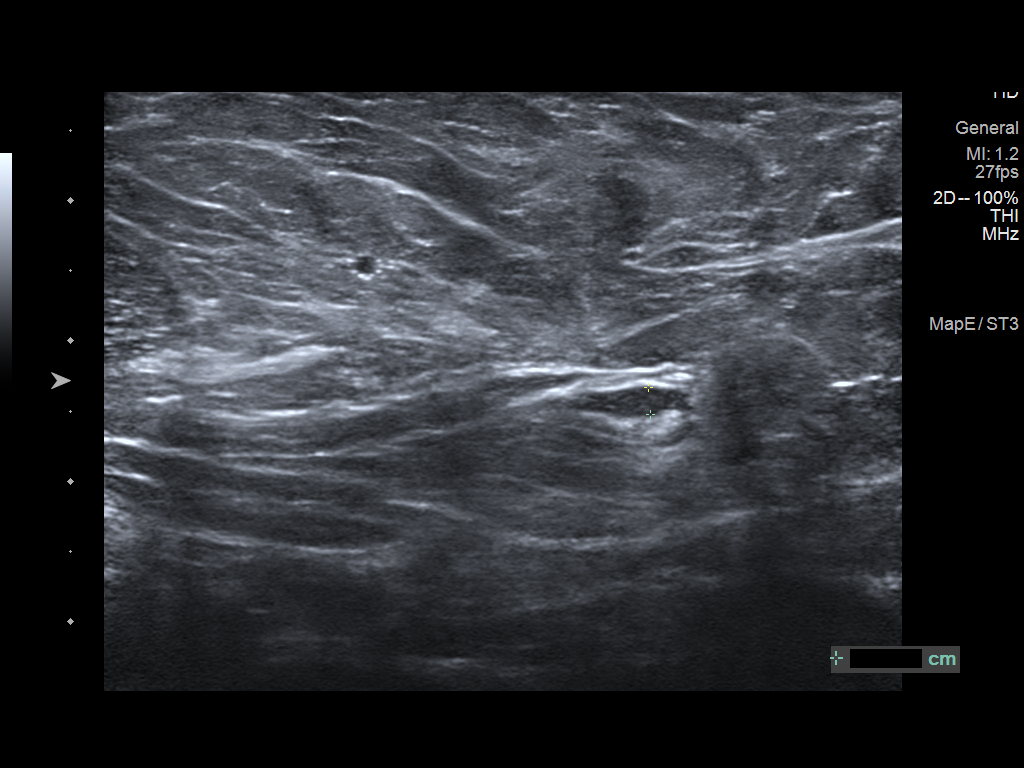

[4 of 4 positions shown; findings below may reference images not displayed]

FINDINGS: Ultrasound is performed, showing subtle hypoechoic thickening of the
skin within the LEFT axilla corresponding to the area of clinical
concern, compatible with a recently resolved sebaceous cyst. No
solid or cystic mass within the underlying soft tissues. No enlarged
lymph nodes seen.
IMPRESSION: No evidence of malignancy or acute findings. Findings compatible
with a recently resolved sebaceous cyst within the skin of the LEFT
axilla.

RECOMMENDATION:
Annual screening mammograms. Next bilateral screening mammogram will
be due Saturday April, 2022.

I have discussed the findings and recommendations with the patient.
If applicable, a reminder letter will be sent to the patient
regarding the next appointment.

BI-RADS CATEGORY  2: Benign.

## 2023-10-24 ENCOUNTER — Other Ambulatory Visit: Payer: Self-pay | Admitting: Obstetrics and Gynecology

## 2023-10-24 DIAGNOSIS — Z1231 Encounter for screening mammogram for malignant neoplasm of breast: Secondary | ICD-10-CM

## 2023-10-25 ENCOUNTER — Ambulatory Visit
Admission: RE | Admit: 2023-10-25 | Discharge: 2023-10-25 | Disposition: A | Discharge status: Home / Self Care | Source: Ambulatory Visit | Attending: Obstetrics and Gynecology | Admitting: Obstetrics and Gynecology

## 2023-10-25 DIAGNOSIS — Z1231 Encounter for screening mammogram for malignant neoplasm of breast: Secondary | ICD-10-CM

## 2024-01-02 ENCOUNTER — Encounter: Payer: Self-pay | Admitting: Family Medicine

## 2024-01-02 ENCOUNTER — Ambulatory Visit: Payer: Self-pay | Admitting: Family Medicine

## 2024-01-02 ENCOUNTER — Telehealth (INDEPENDENT_AMBULATORY_CARE_PROVIDER_SITE_OTHER): Admitting: Family Medicine

## 2024-01-02 VITALS — BP 120/84 | HR 67 | Ht 61.0 in | Wt 194.2 lb

## 2024-01-02 DIAGNOSIS — Z6836 Body mass index (BMI) 36.0-36.9, adult: Secondary | ICD-10-CM | POA: Diagnosis not present

## 2024-01-02 DIAGNOSIS — E66812 Obesity, class 2: Secondary | ICD-10-CM | POA: Diagnosis not present

## 2024-01-02 MED ORDER — TIRZEPATIDE-WEIGHT MANAGEMENT 5 MG/0.5ML ~~LOC~~ SOLN: 5.0000 mg | SUBCUTANEOUS | 1 refills | Status: DC

## 2024-01-02 NOTE — Patient Instructions (Signed)

## 2024-01-02 NOTE — Progress Notes (Signed)
 Virtual Visit via Video Note  I,[name], CMA,acting as a scribe for [employer], NP.,have documented all relevant documentation on the behalf of Bruna Creighton, NP,as directed by  Bruna Creighton, NP while in the presence of Bruna Creighton, NP.  I connected with Michelle Mcneil on 01/02/2024 at 12:00 PM EDT by a video enabled telemedicine application and verified that I am speaking with the correct person using two identifiers.  Patient Location: Home Provider Location: Office/Clinic  I discussed the limitations, risks, security, and privacy concerns of performing an evaluation and management service by video and the availability of in person appointments. I also discussed with the patient that there may be a patient responsible charge related to this service. The patient expressed understanding and agreed to proceed.  Subjective: PCP: Creighton Bruna, NP    Michelle Mcneil is a 58 year old female who presents for weight management.  She has been participating in a weight management program for the past three months, utilizing Weight [employer] and Zepbound . Initially, she started with a 2.5 mg dose of Zepbound , which was less effective, prompting an increase to 5 mg. During this period, she experienced a weight loss of approximately 13 to 17 pounds, which she describes as fluctuating.  She discontinued Zepbound  at the end of July due to financial constraints, as her insurance, [employer], does not cover weight loss medications without a diagnosis of diabetes or obstructive sleep apnea. She has been paying between $300 to $500 out of pocket for the medication, depending on the dose.  She prefers to resume the 5 mg dose due to its effectiveness, despite the higher cost.  She is encouraged to strive for BMI less than 30 to decrease cardiac risk. Advised to aim for at least 150 minutes of exercise per week.     Discussed the use of AI scribe software for clinical note transcription with  the patient, who gave verbal consent to proceed.  History of Present Illness      ROS: Per HPI  Current Outpatient Medications:    acetaminophen  (TYLENOL ) 500 MG tablet, Take 500 mg by mouth every 6 (six) hours as needed (for pain.)., Disp: , Rfl:    b complex vitamins capsule, Take 1 capsule by mouth daily., Disp: , Rfl:    Cholecalciferol (VITAMIN D) 50 MCG (2000 UT) CAPS, Take by mouth., Disp: , Rfl:    ibuprofen (ADVIL,MOTRIN) 200 MG tablet, Take 400 mg by mouth every 8 (eight) hours as needed (for pain.)., Disp: , Rfl:    NURTEC 75 MG TBDP, Take 1 tablet by mouth as needed., Disp: , Rfl:    tirzepatide  5 MG/0.5ML injection vial, Inject 5 mg into the skin once a week., Disp: 2 mL, Rfl: 1  Observations/Objective: Today's Vitals   01/02/24 0939  BP: 120/84  Pulse: 67  Weight: 194 lb 3.2 oz (88.1 kg)  Height: 5' 1 (1.549 m)  PainSc: 0-No pain   Physical Exam Neurological:     Mental Status: She is alert and oriented to person, place, and time.     Assessment and Plan: Class 2 severe obesity due to excess calories with serious comorbidity and body mass index (BMI) of 36.0 to 36.9 in adult [facility]) Assessment & Plan: She is encouraged to strive for BMI less than 30 to decrease cardiac risk. Advised to aim for at least 150 minutes of exercise per week.   Orders: -     Tirzepatide -Weight Management; Inject 5 mg into the  skin once a week.  Dispense: 2 mL; Refill: 1    Follow Up Instructions: Return for 2 month weight check.   I discussed the assessment and treatment plan with the patient. The patient was provided an opportunity to ask questions, and all were answered. The patient agreed with the plan and demonstrated an understanding of the instructions.   The patient was advised to call back or seek an in-person evaluation if the symptoms worsen or if the condition fails to improve as anticipated.  The above assessment and management plan was discussed with the patient.  The patient verbalized understanding of and has agreed to the management plan.  I, Bruna Creighton, NP, have reviewed all documentation for this visit. The documentation on 01/14/2024 for the exam, diagnosis, procedures, and orders are all accurate and complete.

## 2024-01-06 ENCOUNTER — Encounter: Payer: Self-pay | Admitting: Family Medicine

## 2024-01-14 NOTE — Assessment & Plan Note (Signed)
 She is encouraged to strive for BMI less than 30 to decrease cardiac risk. Advised to aim for at least 150 minutes of exercise per week.

## 2024-01-15 ENCOUNTER — Other Ambulatory Visit: Payer: Self-pay

## 2024-01-15 DIAGNOSIS — E66812 Obesity, class 2: Secondary | ICD-10-CM

## 2024-01-20 ENCOUNTER — Other Ambulatory Visit: Payer: Self-pay

## 2024-01-20 MED ORDER — TIRZEPATIDE-WEIGHT MANAGEMENT 5 MG/0.5ML ~~LOC~~ SOLN: 5.0000 mg | SUBCUTANEOUS | 1 refills | Status: DC

## 2024-01-27 ENCOUNTER — Emergency Department (HOSPITAL_BASED_OUTPATIENT_CLINIC_OR_DEPARTMENT_OTHER): Admission: EM | Admit: 2024-01-27 | Discharge: 2024-01-27 | Disposition: A | Discharge status: Home / Self Care

## 2024-01-27 ENCOUNTER — Emergency Department (HOSPITAL_BASED_OUTPATIENT_CLINIC_OR_DEPARTMENT_OTHER): Admitting: Radiology

## 2024-01-27 ENCOUNTER — Other Ambulatory Visit: Payer: Self-pay

## 2024-01-27 ENCOUNTER — Encounter (HOSPITAL_BASED_OUTPATIENT_CLINIC_OR_DEPARTMENT_OTHER): Payer: Self-pay | Admitting: Urology

## 2024-01-27 DIAGNOSIS — R0789 Other chest pain: Secondary | ICD-10-CM | POA: Insufficient documentation

## 2024-01-27 DIAGNOSIS — R519 Headache, unspecified: Secondary | ICD-10-CM | POA: Insufficient documentation

## 2024-01-27 LAB — BASIC METABOLIC PANEL WITH GFR
Anion gap: 14 (ref 5–15)
BUN: 11 mg/dL (ref 6–20)
CO2: 23 mmol/L (ref 22–32)
Calcium: 9.6 mg/dL (ref 8.9–10.3)
Chloride: 100 mmol/L (ref 98–111)
Creatinine, Ser: 0.92 mg/dL (ref 0.44–1.00)
GFR, Estimated: >60 mL/min (ref >60)
Glucose, Bld: 99 mg/dL (ref 70–99)
Potassium: 4.2 mmol/L (ref 3.5–5.1)
Sodium: 136 mmol/L (ref 135–145)

## 2024-01-27 LAB — CBC
HCT: 44 % (ref 36.0–46.0)
Hemoglobin: 14.4 g/dL (ref 12.0–15.0)
MCH: 32.4 pg (ref 26.0–34.0)
MCHC: 32.7 g/dL (ref 30.0–36.0)
MCV: 98.9 fL (ref 80.0–100.0)
Platelets: 285 K/uL (ref 150–400)
RBC: 4.45 MIL/uL (ref 3.87–5.11)
RDW: 13.2 % (ref 11.5–15.5)
WBC: 9.3 K/uL (ref 4.0–10.5)
nRBC: 0 % (ref 0.0–0.2)

## 2024-01-27 LAB — TROPONIN T, HIGH SENSITIVITY: Troponin T High Sensitivity: <15 ng/L (ref 0–19)

## 2024-01-27 NOTE — ED Triage Notes (Signed)
 Pt ambulatory to triage  States radiating pain going down left arm, states some CP yesterday but it went away  BP elevated at 147/97

## 2024-01-27 NOTE — Discharge Instructions (Addendum)
 Encounter for your chest pain was reassuring.  Please follow-up with your PCP.  If your chest pain returns, you develop shortness of breath, calf tenderness or swelling or any other concerning symptom please return to the ED for further evaluation.

## 2024-01-27 NOTE — ED Provider Notes (Signed)
 [place] EMERGENCY DEPARTMENT AT [facility] Provider Note   CSN: [id number] Arrival date & time: 01/27/24  1525     Patient presents with: Chest Pain  HPI Michelle Mcneil is a 58 y.o. female with history of bradycardia, atypical chest pain presenting for chest pain.  She states this started originally in her left shoulder radiate down her left arm and into her chest this all occurred on Saturday and then intermittently on Sunday.  Denies any shortness of breath.  Pain at times was worse with movement.  Nonpleuritic and nonexertional.  Also endorsed a headache over the weekend as well.  At this time she states she is asymptomatic.  Noted that her blood pressure was elevated at home at 147/97.  Denies OCP use, recent long trips, or known history of blood clots.  Does endorse left shoulder surgery.    Chest Pain      Prior to Admission medications   Medication Sig Start Date End Date Taking? Authorizing Provider  acetaminophen  (TYLENOL ) 500 MG tablet Take 500 mg by mouth every 6 (six) hours as needed (for pain.).    [provider]  b complex vitamins capsule Take 1 capsule by mouth daily.    [provider]  Cholecalciferol (VITAMIN D) 50 MCG (2000 UT) CAPS Take by mouth.    [provider]  ibuprofen (ADVIL,MOTRIN) 200 MG tablet Take 400 mg by mouth every 8 (eight) hours as needed (for pain.).    [provider]  NURTEC 75 MG TBDP Take 1 tablet by mouth as needed.    [provider]  tirzepatide  5 MG/0.5ML injection vial Inject 5 mg into the skin once a week. 01/20/24   Petrina Pries, NP    Allergies: Patient has no known allergies.    Review of Systems  Cardiovascular:  Positive for chest pain.    Updated Vital Signs BP 128/86   Pulse 60   Temp 98.1 F (36.7 C)   Resp 18   Ht 5' 1 (1.549 m)   Wt 88.1 kg   LMP 05/31/2020   SpO2 100%   BMI 36.70 kg/m   Physical Exam Vitals and nursing note reviewed.  HENT:      Head: Normocephalic and atraumatic.     Mouth/Throat:     Mouth: Mucous membranes are moist.  Eyes:     General:        Right eye: No discharge.        Left eye: No discharge.     Conjunctiva/sclera: Conjunctivae normal.  Cardiovascular:     Rate and Rhythm: Normal rate and regular rhythm.     Pulses: Normal pulses.     Heart sounds: Normal heart sounds.  Pulmonary:     Effort: Pulmonary effort is normal.     Breath sounds: Normal breath sounds.  Abdominal:     General: Abdomen is flat.     Palpations: Abdomen is soft.  Skin:    General: Skin is warm and dry.  Neurological:     General: No focal deficit present.  Psychiatric:        Mood and Affect: Mood normal.     (all labs ordered are listed, but only abnormal results are displayed) Labs Reviewed  BASIC METABOLIC PANEL WITH GFR  CBC  TROPONIN T, HIGH SENSITIVITY    EKG: EKG Interpretation Date/Time:  Monday January 27 2024 15:42:08 EDT Ventricular Rate:  74 PR Interval:  152 QRS Duration:  66 QT Interval:  384  QTC Calculation: 426 R Axis:   14  Text Interpretation: Normal sinus rhythm Possible Anterior infarct , age undetermined Abnormal ECG No previous ECGs available Confirmed by Neysa Clap [phone]) on 01/27/2024 5:54:17 PM  Radiology: DG Chest 2 View Result Date: 01/27/2024 CLINICAL DATA:  Chest pain with pain radiating down the left arm. EXAM: CHEST - 2 VIEW COMPARISON:  June 14, 2008 FINDINGS: The heart size and mediastinal contours are within normal limits. Both lungs are clear. Prior open reduction internal fixation of the proximal left humerus is seen. The visualized skeletal structures are otherwise unremarkable. IMPRESSION: No active cardiopulmonary disease. Electronically Signed   By: Suzen Dials M.D.   On: 01/27/2024 16:02     Procedures   Medications Ordered in the ED - No data to display              HEART Score: 2                    Medical Decision Making Amount and/or  Complexity of Data Reviewed Labs: ordered. Radiology: ordered.   Initial Impression and Ddx 58 year old well-appearing female presenting for chest pain.  Exam unremarkable.  DDx includes ACS, PE, aortic dissection, pneumothorax, other. Patient PMH that increases complexity of ED encounter:  none  Interpretation of Diagnostics - I independent reviewed and interpreted the labs as followed: none, negative troponin  - I independently visualized the following imaging with scope of interpretation limited to determining acute life threatening conditions related to emergency care: CXR, which revealed no active disease  - I personally reviewed and interpreted EKG which revealed normal sinus rhythm  Patient Reassessment and Ultimate Disposition/Management On reassessment still without symptoms.  She looks well, workup is unremarkable here.  Considered PE also but feel its unlikely given no pleuritic chest pain, lack of risk factors and vitals are normal.  Advised her to follow-up with her PCP.  Discussed return precautions.  Discharged.   Patient management required discussion with the following services or consulting groups:  None  Complexity of Problems Addressed Acute complicated illness or Injury  Additional Data Reviewed and Analyzed Further history obtained from: Past medical history and medications listed in the EMR and Prior ED visit notes  Patient Encounter Risk Assessment Consideration of hospitalization      Final diagnoses:  Atypical chest pain    ED Discharge Orders     None          Lang Norleen POUR, PA-C 01/27/24 1801    Neysa Clap PARAS, DO 01/27/24 2352

## 2024-01-27 NOTE — ED Notes (Signed)
 Reviewed AVS/discharge instruction with patient. Time allotted for and all questions answered. Patient is agreeable for d/c and escorted to ed exit by staff.

## 2024-02-05 ENCOUNTER — Ambulatory Visit: Payer: 59 | Admitting: Family Medicine

## 2024-02-05 ENCOUNTER — Encounter: Payer: Self-pay | Admitting: Family Medicine

## 2024-02-05 VITALS — BP 110/70 | HR 72 | Temp 98.2°F | Wt 193.0 lb

## 2024-02-05 DIAGNOSIS — Z0000 Encounter for general adult medical examination without abnormal findings: Secondary | ICD-10-CM

## 2024-02-05 DIAGNOSIS — E66812 Obesity, class 2: Secondary | ICD-10-CM

## 2024-02-05 DIAGNOSIS — Z6836 Body mass index (BMI) 36.0-36.9, adult: Secondary | ICD-10-CM

## 2024-02-05 DIAGNOSIS — Z136 Encounter for screening for cardiovascular disorders: Secondary | ICD-10-CM

## 2024-02-05 DIAGNOSIS — R030 Elevated blood-pressure reading, without diagnosis of hypertension: Secondary | ICD-10-CM

## 2024-02-05 DIAGNOSIS — Z2821 Immunization not carried out because of patient refusal: Secondary | ICD-10-CM

## 2024-02-05 DIAGNOSIS — Z5320 Procedure and treatment not carried out because of patient's decision for unspecified reasons: Secondary | ICD-10-CM

## 2024-02-05 NOTE — Patient Instructions (Signed)

## 2024-02-05 NOTE — Progress Notes (Signed)
 I,Michelle Mcneil, CMA,acting as a Neurosurgeon for [employer], NP.,have documented all relevant documentation on the behalf of Michelle Creighton, NP,as directed by  Michelle Creighton, NP while in the presence of Michelle Creighton, NP.  Subjective:    Patient ID: Michelle Mcneil , female    DOB: 1966-03-05 , 58 y.o.   MRN: [id number]  Chief Complaint  Patient presents with   Annual Exam    Patient presents today for a physical. Patient is followed by Dr.Cousins for her GYN care. Patient doesn't have any concerns or questions at this time.     HPI Discussed the use of AI scribe software for clinical note transcription with the patient, who gave verbal consent to proceed.  History of Present Illness   Michelle Mcneil is a 58 year old female who presents for her annual physical exam.  She experienced an episode of elevated blood pressure at 145/90 mmHg last week, which she attributes to stress. Her blood pressure has since returned to normal levels.  She visited the emergency department due to arm pain, but no cardiac issues were found, and her troponin levels were normal. She reports a previous shoulder fracture in June 2024.  She has a history of costochondritis, diagnosed after experiencing chest pain and undergoing a normal stress test. She has not had any recent chest pain.  She is currently on hormone replacement therapy, using an estrogen patch and taking progesterone at night. She is up to date with her Pap smear, last done in 2024, and her mammogram, last done in February 2025. She had a colonoscopy with a polyp removal and is on a five-year follow-up schedule.  She recently started on a medication called Zebound, with one injection administered so far. She had an echocardiogram in 2021, which was normal, and a stress test at that time, which was also normal.  She does not smoke or drink alcohol. She takes individual vitamins B and D. No current chest pain, discomfort, or pain.      Past Medical  History:  Diagnosis Date   Adenomatous colon polyp    Anemia    Atypical chest pain 01/06/2016   saw cardiologist - EKG normal-brady, ETT Normal   Bradycardia 01/06/2016   GERD (gastroesophageal reflux disease)    no meds, diet controlled   Pneumonia    as a child   SVD (spontaneous vaginal delivery)    x 2     Family History  Problem Relation Age of Onset   Colon polyps Mother    Alzheimer's disease Mother    Heart attack Mother    Diabetes Father    Hypertension Father    Dementia Father    Breast cancer Sister    Colon cancer Maternal Aunt    Cancer Maternal Aunt        colon   Cancer Son        osteosarcoma   Sleep apnea Neg Hx      Current Outpatient Medications:    b complex vitamins capsule, Take 1 capsule by mouth daily., Disp: , Rfl:    Cholecalciferol (VITAMIN D) 50 MCG (2000 UT) CAPS, Take by mouth., Disp: , Rfl:    ibuprofen (ADVIL,MOTRIN) 200 MG tablet, Take 400 mg by mouth every 8 (eight) hours as needed (for pain.)., Disp: , Rfl:    NURTEC 75 MG TBDP, Take 1 tablet by mouth as needed., Disp: , Rfl:    tirzepatide  5 MG/0.5ML injection vial, Inject 5 mg into the  skin once a week., Disp: 2 mL, Rfl: 1   acetaminophen  (TYLENOL ) 500 MG tablet, Take 500 mg by mouth every 6 (six) hours as needed (for pain.). (Patient not taking: Reported on 02/05/2024), Disp: , Rfl:    No Known Allergies      Social History   Substance and Sexual Activity  Alcohol Use No    Review of Systems  Constitutional: Negative.   HENT: Negative.    Eyes: Negative.   Respiratory: Negative.    Cardiovascular: Negative.   Gastrointestinal: Negative.   Endocrine: Negative.   Genitourinary: Negative.   Musculoskeletal: Negative.   Skin: Negative.   Allergic/Immunologic: Negative.   Neurological: Negative.   Hematological: Negative.   Psychiatric/Behavioral: Negative.       Today's Vitals   02/05/24 0826  BP: 110/70  Pulse: 72  Temp: 98.2 F (36.8 C)  Weight: 193 lb  (87.5 kg)  PainSc: 0-No pain   Body mass index is 36.47 kg/m.  Wt Readings from Last 3 Encounters:  02/05/24 193 lb (87.5 kg)  01/27/24 194 lb 3.6 oz (88.1 kg)  01/02/24 194 lb 3.2 oz (88.1 kg)     Objective:  Physical Exam Constitutional:      Appearance: Normal appearance.  HENT:     Head: Normocephalic.     Nose: Nose normal.  Cardiovascular:     Rate and Rhythm: Normal rate and regular rhythm.     Pulses: Normal pulses.     Heart sounds: Normal heart sounds.  Pulmonary:     Effort: Pulmonary effort is normal.     Breath sounds: Normal breath sounds.  Abdominal:     General: Bowel sounds are normal.  Musculoskeletal:        General: Normal range of motion.  Skin:    General: Skin is warm and dry.  Neurological:     General: No focal deficit present.     Mental Status: She is alert and oriented to person, place, and time. Mental status is at baseline.  Psychiatric:        Mood and Affect: Mood normal.         Assessment And Plan:     Encounter for general adult medical examination w/o abnormal findings  Influenza vaccination declined  HIV screening declined  Pneumococcal vaccination declined  Herpes zoster vaccination declined  Encounter for screening for cardiovascular disorders -     Lipid panel -     Lipoprotein A (LPA)  Elevated BP without diagnosis of hypertension Assessment & Plan: Blood pressure elevated at 145/90 last week, likely stress-related, resolved without intervention. - Monitor blood pressure at home. - Discussed cardiac scoring test as a potential future consideration.    Class 2 severe obesity due to excess calories with serious comorbidity and body mass index (BMI) of 36.0 to 36.9 in adult Assessment & Plan: She is encouraged to strive for BMI less than 30 to decrease cardiac risk. Advised to aim for at least 150 minutes of exercise per week.     Assessment & Plan Elevated blood pressure Blood pressure elevated at 145/90  last week, likely stress-related, resolved without intervention. - Monitor blood pressure at home. - Discussed cardiac scoring test as a potential future consideration.  Arm pain Recent arm pain evaluated in ED, no cardiac issues found, likely related to previous shoulder fracture. Troponins normal. No current chest pain.  Class 2 severe obesity, BMI 36.0-36.9 Started on Tirzepatide  5 mg subcutaneous weekly. Insurance issues resolved, one dose administered. -  Follow up in 2 months to assess progress with Tirzepatide , including weight changes and potential dose adjustments. - Allow for telehealth visits if she can provide accurate weight measurements from home.  Post-menopausal bone health Discussed importance of vitamin D for bone health post-menopause. - Encourage continued use of B complex and vitamin D supplements.  General health maintenance Up to date with Pap smear, mammogram, and colonoscopy. - Check cholesterol levels today. - Administer flu vaccine at a later date, she to schedule. - Encourage self-breast exams monthly.  Hormone replacement therapy Continues estrogen patch and nightly progesterone as per GYN's guidance.   Return for 1 year physical, 2 months weight mgt. Patient was given opportunity to ask questions. Patient verbalized understanding of the plan and was able to repeat key elements of the plan. All questions were answered to their satisfaction.    I, Michelle Creighton, NP, have reviewed all documentation for this visit. The documentation on 02/13/2024 for the exam, diagnosis, procedures, and orders are all accurate and complete.

## 2024-02-06 LAB — LIPOPROTEIN A (LPA): Lipoprotein (a): 39.4 nmol/L (ref <75.0)

## 2024-02-06 LAB — LIPID PANEL
Chol/HDL Ratio: 3.1 ratio (ref 0.0–4.4)
Cholesterol, Total: 178 mg/dL (ref 100–199)
HDL: 57 mg/dL (ref >39)
LDL Chol Calc (NIH): 106 mg/dL — ABNORMAL HIGH (ref 0–99)
Triglycerides: 82 mg/dL (ref 0–149)
VLDL Cholesterol Cal: 15 mg/dL (ref 5–40)

## 2024-02-10 ENCOUNTER — Ambulatory Visit: Payer: Self-pay

## 2024-02-14 ENCOUNTER — Ambulatory Visit: Payer: Self-pay | Admitting: Family Medicine

## 2024-02-14 DIAGNOSIS — Z2821 Immunization not carried out because of patient refusal: Secondary | ICD-10-CM | POA: Insufficient documentation

## 2024-02-14 DIAGNOSIS — Z5320 Procedure and treatment not carried out because of patient's decision for unspecified reasons: Secondary | ICD-10-CM | POA: Insufficient documentation

## 2024-02-14 DIAGNOSIS — Z0000 Encounter for general adult medical examination without abnormal findings: Secondary | ICD-10-CM | POA: Insufficient documentation

## 2024-02-14 DIAGNOSIS — Z136 Encounter for screening for cardiovascular disorders: Secondary | ICD-10-CM | POA: Insufficient documentation

## 2024-02-14 NOTE — Progress Notes (Signed)
 Lipoprotein is normal, LDL is just slightly above normal. Low fat diet advised.   Thank you!

## 2024-02-14 NOTE — Assessment & Plan Note (Signed)
 She is encouraged to strive for BMI less than 30 to decrease cardiac risk. Advised to aim for at least 150 minutes of exercise per week.

## 2024-02-14 NOTE — Assessment & Plan Note (Signed)
 Blood pressure elevated at 145/90 last week, likely stress-related, resolved without intervention. - Monitor blood pressure at home. - Discussed cardiac scoring test as a potential future consideration.

## 2024-02-18 ENCOUNTER — Encounter: Payer: Self-pay | Admitting: Family Medicine

## 2024-02-18 ENCOUNTER — Ambulatory Visit (INDEPENDENT_AMBULATORY_CARE_PROVIDER_SITE_OTHER)

## 2024-02-18 VITALS — BP 110/72 | HR 98 | Temp 97.7°F | Ht 61.0 in | Wt 193.0 lb

## 2024-02-18 DIAGNOSIS — Z23 Encounter for immunization: Secondary | ICD-10-CM | POA: Diagnosis not present

## 2024-02-18 NOTE — Progress Notes (Signed)
 Patient is in office today for a nurse visit for Immunization. Patient Injection was given in the  Right deltoid. Patient tolerated injection well.

## 2024-02-20 ENCOUNTER — Other Ambulatory Visit: Payer: Self-pay

## 2024-02-20 DIAGNOSIS — E66812 Obesity, class 2: Secondary | ICD-10-CM

## 2024-02-20 MED ORDER — TIRZEPATIDE-WEIGHT MANAGEMENT 7.5 MG/0.5ML ~~LOC~~ SOLN: 7.5000 mg | SUBCUTANEOUS | 1 refills | Status: DC

## 2024-04-07 ENCOUNTER — Other Ambulatory Visit: Payer: Self-pay | Admitting: Family Medicine

## 2024-04-07 DIAGNOSIS — E66812 Obesity, class 2: Secondary | ICD-10-CM

## 2024-04-08 ENCOUNTER — Encounter: Payer: Self-pay | Admitting: Family Medicine

## 2024-04-08 ENCOUNTER — Ambulatory Visit: Payer: Self-pay | Admitting: Family Medicine

## 2024-04-08 VITALS — BP 110/64 | HR 72 | Temp 98.1°F | Ht 61.0 in | Wt 183.0 lb

## 2024-04-08 DIAGNOSIS — Z6834 Body mass index (BMI) 34.0-34.9, adult: Secondary | ICD-10-CM

## 2024-04-08 DIAGNOSIS — E66811 Obesity, class 1: Secondary | ICD-10-CM

## 2024-04-08 DIAGNOSIS — E559 Vitamin D deficiency, unspecified: Secondary | ICD-10-CM | POA: Diagnosis not present

## 2024-04-08 DIAGNOSIS — E6609 Other obesity due to excess calories: Secondary | ICD-10-CM

## 2024-04-08 MED ORDER — TIRZEPATIDE-WEIGHT MANAGEMENT 10 MG/0.5ML ~~LOC~~ SOLN: 10.0000 mg | SUBCUTANEOUS | 1 refills | Status: AC

## 2024-04-08 NOTE — Progress Notes (Unsigned)
 I,[name], CMA,acting as a neurosurgeon for [employer], NP.,have documented all relevant documentation on the behalf of Bruna Creighton, NP,as directed by  Bruna Creighton, NP while in the presence of Bruna Creighton, NP.  Subjective:  Patient ID: Michelle Mcneil , female    DOB: 06/11/1965 , 58 y.o.   MRN: [id number]  Chief Complaint  Patient presents with   Weight Check    Patient presents today for a weight check. Patient reports    vitamin d    Patient reports she would like for her vitamin D to be checked. She reports she use to have it checked yearly.    HPI Discussed the use of AI scribe software for clinical note transcription with the patient, who gave verbal consent to proceed.  History of Present Illness     Michelle Mcneil is a 58 year old female who presents for weight management.  She has lost ten pounds since her last visit in October, reducing her weight from 193 pounds to 183 pounds. She attributes this weight loss to the medication she started on October 22 at a dose of 7.5 mg, which helps with her cravings. She has not experienced any side effects such as stomach discomfort or constipation.  She is considering increasing the medication dose. However, she discusses the cost of the medication, noting that it is not affordable despite being told it would be cheaper. She has not received any coupons and pays out of pocket.  She inquires about checking her vitamin D levels, as they were not checked during her last physical in October. She takes extra vitamin D, vitamin B, and a multivitamin regularly.  She works from 8 AM to 5 PM and was on her way to work after the appointment.      Past Medical History:  Diagnosis Date   Adenomatous colon polyp    Anemia    Atypical chest pain 01/06/2016   saw cardiologist - EKG normal-brady, ETT Normal   Bradycardia 01/06/2016   GERD (gastroesophageal reflux disease)    no meds, diet controlled   Pneumonia    as a child   SVD  (spontaneous vaginal delivery)    x 2     Family History  Problem Relation Age of Onset   Colon polyps Mother    Alzheimer's disease Mother    Heart attack Mother    Diabetes Father    Hypertension Father    Dementia Father    Breast cancer Sister    Colon cancer Maternal Aunt    Cancer Maternal Aunt        colon   Cancer Son        osteosarcoma   Sleep apnea Neg Hx      Current Outpatient Medications:    b complex vitamins capsule, Take 1 capsule by mouth daily., Disp: , Rfl:    Cholecalciferol (VITAMIN D) 50 MCG (2000 UT) CAPS, Take by mouth., Disp: , Rfl:    ibuprofen (ADVIL,MOTRIN) 200 MG tablet, Take 400 mg by mouth every 8 (eight) hours as needed (for pain.)., Disp: , Rfl:    NURTEC 75 MG TBDP, Take 1 tablet by mouth as needed., Disp: , Rfl:    tirzepatide  10 MG/0.5ML injection vial, Inject 10 mg into the skin every 7 (seven) days., Disp: 3 mL, Rfl: 1   No Known Allergies   Review of Systems  Constitutional: Negative.   HENT: Negative.    Respiratory: Negative.    Neurological: Negative.  Psychiatric/Behavioral: Negative.       Today's Vitals   04/08/24 0844  BP: 110/64  Pulse: 72  Temp: 98.1 F (36.7 C)  TempSrc: Oral  Weight: 183 lb (83 kg)  Height: 5' 1 (1.549 m)  PainSc: 0-No pain   Body mass index is 34.58 kg/m.  Wt Readings from Last 3 Encounters:  04/08/24 183 lb (83 kg)  02/18/24 193 lb (87.5 kg)  02/05/24 193 lb (87.5 kg)    The 10-year ASCVD risk score (Arnett DK, et al., 2019) is: 2.4%   Values used to calculate the score:     Age: 13 years     Clinically relevant sex: Female     Is Non-Hispanic African American: Yes     Diabetic: No     Tobacco smoker: No     Systolic Blood Pressure: 110 mmHg     Is BP treated: No     HDL Cholesterol: 57 mg/dL     Total Cholesterol: 178 mg/dL  Objective:  Physical Exam Constitutional:      Appearance: Normal appearance.  Cardiovascular:     Rate and Rhythm: Normal rate and regular rhythm.      Pulses: Normal pulses.     Heart sounds: Normal heart sounds.  Pulmonary:     Effort: Pulmonary effort is normal.     Breath sounds: Normal breath sounds.  Abdominal:     General: Bowel sounds are normal.  Neurological:     Mental Status: She is alert.         Assessment And Plan:   Assessment & Plan Vitamin D deficiency - Ordered vitamin D level test.   Class 1 obesity due to excess calories with serious comorbidity and body mass index (BMI) of 34.0 to 34.9 in adult She lost 10 pounds since October, currently 183 pounds. Mounjaro  7.5 mg effective for cravings without side effects. Ready to increase dose.   Return for 2 month weight check.  Patient was given opportunity to ask questions. Patient verbalized understanding of the plan and was able to repeat key elements of the plan. All questions were answered to their satisfaction.    I, Bruna Creighton, NP, have reviewed all documentation for this visit. The documentation on 04/12/2024 for the exam, diagnosis, procedures, and orders are all accurate and complete.    IF YOU HAVE BEEN REFERRED TO A SPECIALIST, IT MAY TAKE 1-2 WEEKS TO SCHEDULE/PROCESS THE REFERRAL. IF YOU HAVE NOT HEARD FROM US /SPECIALIST IN TWO WEEKS, PLEASE GIVE US  A CALL AT [phone].

## 2024-04-08 NOTE — Patient Instructions (Signed)

## 2024-04-09 LAB — VITAMIN D 25 HYDROXY (VIT D DEFICIENCY, FRACTURES): Vit D, 25-Hydroxy: 144 ng/mL — ABNORMAL HIGH (ref 30.0–100.0)

## 2024-04-13 ENCOUNTER — Ambulatory Visit: Payer: Self-pay | Admitting: Family Medicine

## 2024-04-13 NOTE — Progress Notes (Signed)
 Vitamin d levels are above normal. If you are on a vitamin supplement, stop daily use and take every other day.  Thanks!

## 2024-04-13 NOTE — Assessment & Plan Note (Signed)
-   Ordered vitamin D  level test.

## 2024-04-13 NOTE — Assessment & Plan Note (Signed)
 She lost 10 pounds since October, currently 183 pounds. Mounjaro  7.5 mg effective for cravings without side effects. Ready to increase dose.

## 2024-06-11 ENCOUNTER — Ambulatory Visit: Admitting: Family Medicine

## 2025-02-10 ENCOUNTER — Encounter: Payer: Self-pay | Admitting: Family Medicine
